# Patient Record
Sex: Male | Born: 1935
Health system: Southern US, Community
[De-identification: ages and names within clinical notes are randomized; demographics above are authoritative.]

## PROBLEM LIST (undated history)

## (undated) DIAGNOSIS — M5137 Other intervertebral disc degeneration, lumbosacral region: Secondary | ICD-10-CM

## (undated) DIAGNOSIS — M51379 Other intervertebral disc degeneration, lumbosacral region without mention of lumbar back pain or lower extremity pain: Secondary | ICD-10-CM

## (undated) DIAGNOSIS — K5790 Diverticulosis of intestine, part unspecified, without perforation or abscess without bleeding: Secondary | ICD-10-CM

## (undated) DIAGNOSIS — K602 Anal fissure, unspecified: Secondary | ICD-10-CM

## (undated) DIAGNOSIS — Z8679 Personal history of other diseases of the circulatory system: Secondary | ICD-10-CM

## (undated) DIAGNOSIS — M47816 Spondylosis without myelopathy or radiculopathy, lumbar region: Secondary | ICD-10-CM

## (undated) DIAGNOSIS — I6521 Occlusion and stenosis of right carotid artery: Principal | ICD-10-CM

## (undated) DIAGNOSIS — C61 Malignant neoplasm of prostate: Secondary | ICD-10-CM

## (undated) DIAGNOSIS — K219 Gastro-esophageal reflux disease without esophagitis: Secondary | ICD-10-CM

## (undated) DIAGNOSIS — Z79818 Long term (current) use of other agents affecting estrogen receptors and estrogen levels: Secondary | ICD-10-CM

## (undated) DIAGNOSIS — I2510 Atherosclerotic heart disease of native coronary artery without angina pectoris: Secondary | ICD-10-CM

## (undated) DIAGNOSIS — N529 Male erectile dysfunction, unspecified: Secondary | ICD-10-CM

## (undated) DIAGNOSIS — R972 Elevated prostate specific antigen [PSA]: Secondary | ICD-10-CM

## (undated) DIAGNOSIS — R7303 Prediabetes: Secondary | ICD-10-CM

## (undated) DIAGNOSIS — Z789 Other specified health status: Secondary | ICD-10-CM

## (undated) DIAGNOSIS — E785 Hyperlipidemia, unspecified: Secondary | ICD-10-CM

## (undated) DIAGNOSIS — K222 Esophageal obstruction: Secondary | ICD-10-CM

## (undated) DIAGNOSIS — G459 Transient cerebral ischemic attack, unspecified: Secondary | ICD-10-CM

## (undated) DIAGNOSIS — R7302 Impaired glucose tolerance (oral): Secondary | ICD-10-CM

## (undated) DIAGNOSIS — K589 Irritable bowel syndrome without diarrhea: Secondary | ICD-10-CM

## (undated) DIAGNOSIS — K279 Peptic ulcer, site unspecified, unspecified as acute or chronic, without hemorrhage or perforation: Secondary | ICD-10-CM

## (undated) DIAGNOSIS — T7840XA Allergy, unspecified, initial encounter: Secondary | ICD-10-CM

## (undated) DIAGNOSIS — N400 Enlarged prostate without lower urinary tract symptoms: Secondary | ICD-10-CM

## (undated) DIAGNOSIS — I10 Essential (primary) hypertension: Secondary | ICD-10-CM

## (undated) DIAGNOSIS — Z87898 Personal history of other specified conditions: Secondary | ICD-10-CM

## (undated) DIAGNOSIS — Z86010 Personal history of colonic polyps: Secondary | ICD-10-CM

## (undated) DIAGNOSIS — K429 Umbilical hernia without obstruction or gangrene: Secondary | ICD-10-CM

## (undated) HISTORY — PX: DUPUYTREN CONTRACTURE RELEASE: SHX1478

## (undated) HISTORY — DX: Peptic ulcer, site unspecified, unspecified as acute or chronic, without hemorrhage or perforation: K27.9

## (undated) HISTORY — DX: Benign prostatic hyperplasia without lower urinary tract symptoms: N40.0

## (undated) HISTORY — DX: Atherosclerotic heart disease of native coronary artery without angina pectoris: I25.10

## (undated) HISTORY — DX: Diverticulosis of intestine, part unspecified, without perforation or abscess without bleeding: K57.90

## (undated) HISTORY — DX: Occlusion and stenosis of right carotid artery: I65.21

## (undated) HISTORY — DX: Gastro-esophageal reflux disease without esophagitis: K21.9

## (undated) HISTORY — DX: Anal fissure, unspecified: K60.2

## (undated) HISTORY — PX: APPENDECTOMY: SHX54

## (undated) HISTORY — DX: Esophageal obstruction: K22.2

## (undated) HISTORY — PX: ARM WOUND REPAIR / CLOSURE: SUR1141

## (undated) HISTORY — DX: Impaired glucose tolerance (oral): R73.02

## (undated) HISTORY — DX: Irritable bowel syndrome without diarrhea: K58.9

## (undated) HISTORY — DX: Hyperlipidemia, unspecified: E78.5

## (undated) HISTORY — PX: HAND SURGERY: SHX662

## (undated) HISTORY — DX: Male erectile dysfunction, unspecified: N52.9

## (undated) HISTORY — DX: Personal history of colonic polyps: Z86.010

## (undated) HISTORY — DX: Personal history of other diseases of the circulatory system: Z86.79

## (undated) HISTORY — DX: Essential (primary) hypertension: I10

## (undated) HISTORY — DX: Elevated prostate specific antigen (PSA): R97.20

---

## 2002-11-01 DIAGNOSIS — K5790 Diverticulosis of intestine, part unspecified, without perforation or abscess without bleeding: Secondary | ICD-10-CM

## 2002-11-01 HISTORY — DX: Diverticulosis of intestine, part unspecified, without perforation or abscess without bleeding: K57.90

## 2003-04-05 ENCOUNTER — Encounter: Payer: Self-pay | Admitting: Internal Medicine

## 2003-04-05 ENCOUNTER — Encounter: Payer: Self-pay | Admitting: Cardiology

## 2003-04-05 ENCOUNTER — Encounter: Payer: Self-pay | Admitting: Emergency Medicine

## 2003-04-05 ENCOUNTER — Inpatient Hospital Stay (HOSPITAL_COMMUNITY): Admission: EM | Admit: 2003-04-05 | Discharge: 2003-04-06 | Payer: Self-pay | Admitting: Emergency Medicine

## 2003-04-24 ENCOUNTER — Encounter: Payer: Self-pay | Admitting: Neurology

## 2003-04-24 ENCOUNTER — Ambulatory Visit (HOSPITAL_COMMUNITY): Admission: RE | Admit: 2003-04-24 | Discharge: 2003-04-24 | Payer: Self-pay | Admitting: Neurology

## 2004-10-26 ENCOUNTER — Ambulatory Visit: Payer: Self-pay | Admitting: Internal Medicine

## 2004-11-26 ENCOUNTER — Ambulatory Visit: Payer: Self-pay | Admitting: Internal Medicine

## 2005-06-23 ENCOUNTER — Ambulatory Visit: Payer: Self-pay | Admitting: Internal Medicine

## 2005-10-21 ENCOUNTER — Ambulatory Visit: Payer: Self-pay | Admitting: Internal Medicine

## 2005-11-12 ENCOUNTER — Ambulatory Visit: Payer: Self-pay | Admitting: Internal Medicine

## 2005-11-19 ENCOUNTER — Ambulatory Visit: Payer: Self-pay | Admitting: Internal Medicine

## 2005-12-16 ENCOUNTER — Emergency Department (HOSPITAL_COMMUNITY): Admission: EM | Admit: 2005-12-16 | Discharge: 2005-12-16 | Payer: Self-pay | Admitting: Emergency Medicine

## 2006-07-12 ENCOUNTER — Ambulatory Visit: Payer: Self-pay | Admitting: Internal Medicine

## 2006-10-21 ENCOUNTER — Ambulatory Visit: Payer: Self-pay | Admitting: Internal Medicine

## 2006-11-16 ENCOUNTER — Ambulatory Visit: Payer: Self-pay | Admitting: Internal Medicine

## 2006-11-16 LAB — CONVERTED CEMR LAB
ALT: 21 units/L (ref 0–40)
AST: 23 units/L (ref 0–37)
Basophils Relative: 0.1 % (ref 0.0–1.0)
Bilirubin Urine: NEGATIVE
Chloride: 106 meq/L (ref 96–112)
Eosinophil percent: 1.8 % (ref 0.0–5.0)
GFR calc non Af Amer: 71 mL/min
HCT: 49.4 % (ref 39.0–52.0)
HDL: 38.1 mg/dL — ABNORMAL LOW (ref >39.0)
Hemoglobin, Urine: NEGATIVE
Leukocytes, UA: NEGATIVE
MCV: 92.3 fL (ref 78.0–100.0)
Monocytes Absolute: 0.6 10*3/uL (ref 0.2–0.7)
PSA: 14.03 ng/mL — ABNORMAL HIGH (ref 0.10–4.00)
Potassium: 3.8 meq/L (ref 3.5–5.1)
RBC: 5.36 M/uL (ref 4.22–5.81)
RDW: 13.4 % (ref 11.5–14.6)
Sodium: 140 meq/L (ref 135–145)
Specific Gravity, Urine: 1.02 (ref 1.000–1.03)
Total Protein, Urine: NEGATIVE mg/dL
Total Protein: 7 g/dL (ref 6.0–8.3)
Triglyceride fasting, serum: 51 mg/dL (ref 0–149)
WBC: 5.8 10*3/uL (ref 4.5–10.5)

## 2006-12-08 ENCOUNTER — Ambulatory Visit: Payer: Self-pay

## 2006-12-08 ENCOUNTER — Encounter: Payer: Self-pay | Admitting: Cardiology

## 2006-12-21 ENCOUNTER — Ambulatory Visit: Payer: Self-pay | Admitting: Internal Medicine

## 2007-07-19 ENCOUNTER — Ambulatory Visit: Payer: Self-pay | Admitting: Gastroenterology

## 2007-07-26 ENCOUNTER — Ambulatory Visit: Payer: Self-pay | Admitting: Gastroenterology

## 2007-10-02 ENCOUNTER — Ambulatory Visit: Payer: Self-pay | Admitting: Internal Medicine

## 2008-01-04 ENCOUNTER — Telehealth: Payer: Self-pay | Admitting: Internal Medicine

## 2008-02-07 ENCOUNTER — Ambulatory Visit: Payer: Self-pay | Admitting: Internal Medicine

## 2008-02-07 ENCOUNTER — Encounter (INDEPENDENT_AMBULATORY_CARE_PROVIDER_SITE_OTHER): Payer: Self-pay | Admitting: *Deleted

## 2008-02-07 DIAGNOSIS — K279 Peptic ulcer, site unspecified, unspecified as acute or chronic, without hemorrhage or perforation: Secondary | ICD-10-CM

## 2008-02-07 DIAGNOSIS — Z860100 Personal history of colon polyps, unspecified: Secondary | ICD-10-CM | POA: Insufficient documentation

## 2008-02-07 DIAGNOSIS — K219 Gastro-esophageal reflux disease without esophagitis: Secondary | ICD-10-CM

## 2008-02-07 DIAGNOSIS — Z8679 Personal history of other diseases of the circulatory system: Secondary | ICD-10-CM

## 2008-02-07 DIAGNOSIS — E739 Lactose intolerance, unspecified: Secondary | ICD-10-CM

## 2008-02-07 DIAGNOSIS — E785 Hyperlipidemia, unspecified: Secondary | ICD-10-CM | POA: Insufficient documentation

## 2008-02-07 DIAGNOSIS — I10 Essential (primary) hypertension: Secondary | ICD-10-CM

## 2008-02-07 DIAGNOSIS — R030 Elevated blood-pressure reading, without diagnosis of hypertension: Secondary | ICD-10-CM | POA: Insufficient documentation

## 2008-02-07 DIAGNOSIS — K589 Irritable bowel syndrome without diarrhea: Secondary | ICD-10-CM

## 2008-02-07 DIAGNOSIS — M259 Joint disorder, unspecified: Secondary | ICD-10-CM | POA: Insufficient documentation

## 2008-02-07 DIAGNOSIS — N400 Enlarged prostate without lower urinary tract symptoms: Secondary | ICD-10-CM

## 2008-02-07 HISTORY — DX: Benign prostatic hyperplasia without lower urinary tract symptoms: N40.0

## 2008-02-07 HISTORY — DX: Personal history of colonic polyps: Z86.010

## 2008-02-07 HISTORY — DX: Gastro-esophageal reflux disease without esophagitis: K21.9

## 2008-02-07 HISTORY — DX: Irritable bowel syndrome, unspecified: K58.9

## 2008-02-07 HISTORY — DX: Hyperlipidemia, unspecified: E78.5

## 2008-02-07 HISTORY — DX: Essential (primary) hypertension: I10

## 2008-02-07 HISTORY — DX: Personal history of other diseases of the circulatory system: Z86.79

## 2008-02-07 HISTORY — DX: Peptic ulcer, site unspecified, unspecified as acute or chronic, without hemorrhage or perforation: K27.9

## 2008-02-07 HISTORY — DX: Personal history of colon polyps, unspecified: Z86.0100

## 2008-02-26 ENCOUNTER — Encounter: Payer: Self-pay | Admitting: Internal Medicine

## 2008-09-24 ENCOUNTER — Ambulatory Visit: Payer: Self-pay | Admitting: Internal Medicine

## 2008-10-18 ENCOUNTER — Telehealth (INDEPENDENT_AMBULATORY_CARE_PROVIDER_SITE_OTHER): Payer: Self-pay | Admitting: *Deleted

## 2008-10-18 ENCOUNTER — Ambulatory Visit: Payer: Self-pay | Admitting: Internal Medicine

## 2008-10-30 ENCOUNTER — Telehealth (INDEPENDENT_AMBULATORY_CARE_PROVIDER_SITE_OTHER): Payer: Self-pay | Admitting: *Deleted

## 2009-01-16 ENCOUNTER — Ambulatory Visit: Payer: Self-pay | Admitting: Internal Medicine

## 2009-02-03 ENCOUNTER — Telehealth: Payer: Self-pay | Admitting: Internal Medicine

## 2009-03-28 ENCOUNTER — Ambulatory Visit: Payer: Self-pay | Admitting: Internal Medicine

## 2009-03-28 DIAGNOSIS — M79609 Pain in unspecified limb: Secondary | ICD-10-CM

## 2009-03-28 DIAGNOSIS — M549 Dorsalgia, unspecified: Secondary | ICD-10-CM | POA: Insufficient documentation

## 2009-09-30 ENCOUNTER — Ambulatory Visit: Payer: Self-pay | Admitting: Internal Medicine

## 2010-05-01 ENCOUNTER — Ambulatory Visit: Payer: Self-pay | Admitting: Internal Medicine

## 2010-08-05 ENCOUNTER — Ambulatory Visit: Payer: Self-pay | Admitting: Cardiology

## 2010-08-05 ENCOUNTER — Ambulatory Visit: Payer: Self-pay | Admitting: Internal Medicine

## 2010-08-05 DIAGNOSIS — R519 Headache, unspecified: Secondary | ICD-10-CM | POA: Insufficient documentation

## 2010-08-05 DIAGNOSIS — M542 Cervicalgia: Secondary | ICD-10-CM

## 2010-08-05 DIAGNOSIS — R51 Headache: Secondary | ICD-10-CM

## 2010-08-06 ENCOUNTER — Ambulatory Visit: Payer: Self-pay | Admitting: Cardiovascular Disease

## 2010-09-10 ENCOUNTER — Ambulatory Visit: Payer: Self-pay | Admitting: Internal Medicine

## 2011-01-17 LAB — CONVERTED CEMR LAB
ALT: 22 units/L (ref 0–53)
AST: 21 units/L (ref 0–37)
Albumin: 3.9 g/dL (ref 3.5–5.2)
Alkaline Phosphatase: 74 units/L (ref 39–117)
Alkaline Phosphatase: 77 units/L (ref 39–117)
Alkaline Phosphatase: 83 units/L (ref 39–117)
BUN: 13 mg/dL (ref 6–23)
BUN: 14 mg/dL (ref 6–23)
BUN: 14 mg/dL (ref 6–23)
Basophils Absolute: 0 10*3/uL (ref 0.0–0.1)
Basophils Absolute: 0.1 10*3/uL (ref 0.0–0.1)
Basophils Relative: 0.2 % (ref 0.0–3.0)
Basophils Relative: 2.4 % — ABNORMAL HIGH (ref 0.0–1.0)
Bilirubin Urine: NEGATIVE
Bilirubin Urine: NEGATIVE
Bilirubin, Direct: 0.1 mg/dL (ref 0.0–0.3)
Bilirubin, Direct: 0.2 mg/dL (ref 0.0–0.3)
CO2: 28 meq/L (ref 19–32)
CO2: 30 meq/L (ref 19–32)
Calcium: 9.2 mg/dL (ref 8.4–10.5)
Calcium: 9.3 mg/dL (ref 8.4–10.5)
Cholesterol: 149 mg/dL (ref 0–200)
Creatinine, Ser: 1.1 mg/dL (ref 0.4–1.5)
Eosinophils Relative: 2 % (ref 0.0–5.0)
GFR calc Af Amer: 85 mL/min
GFR calc non Af Amer: 79.81 mL/min (ref >60)
Glucose, Bld: 96 mg/dL (ref 70–99)
Glucose, Bld: 99 mg/dL (ref 70–99)
HCT: 44.5 % (ref 39.0–52.0)
HCT: 46 % (ref 39.0–52.0)
HDL: 36.3 mg/dL — ABNORMAL LOW (ref >39.0)
HDL: 39.3 mg/dL (ref >39.0)
Hemoglobin, Urine: NEGATIVE
Hemoglobin, Urine: NEGATIVE
Hemoglobin: 15.4 g/dL (ref 13.0–17.0)
Hemoglobin: 15.8 g/dL (ref 13.0–17.0)
Hgb A1c MFr Bld: 6 % (ref 4.6–6.0)
LDL Cholesterol: 72 mg/dL (ref 0–99)
Leukocytes, UA: NEGATIVE
Leukocytes, UA: NEGATIVE
Leukocytes, UA: NEGATIVE
Lymphocytes Relative: 20.6 % (ref 12.0–46.0)
Lymphocytes Relative: 22.9 % (ref 12.0–46.0)
Lymphocytes Relative: 23.5 % (ref 12.0–46.0)
MCHC: 34.5 g/dL (ref 30.0–36.0)
Monocytes Absolute: 0.4 10*3/uL (ref 0.2–0.7)
Monocytes Absolute: 0.6 10*3/uL (ref 0.1–1.0)
Monocytes Relative: 12.2 % — ABNORMAL HIGH (ref 3.0–12.0)
Monocytes Relative: 7.7 % (ref 3.0–11.0)
Monocytes Relative: 9.9 % (ref 3.0–12.0)
Neutro Abs: 3.7 10*3/uL (ref 1.4–7.7)
Neutro Abs: 3.8 10*3/uL (ref 1.4–7.7)
Neutrophils Relative %: 67.9 % (ref 43.0–77.0)
Nitrite: NEGATIVE
Nitrite: NEGATIVE
PSA: 18.5 ng/mL — ABNORMAL HIGH (ref 0.10–4.00)
Platelets: 244 10*3/uL (ref 150.0–400.0)
Potassium: 3.9 meq/L (ref 3.5–5.1)
RBC: 5.06 M/uL (ref 4.22–5.81)
RDW: 13.3 % (ref 11.5–14.6)
RDW: 14 % (ref 11.5–14.6)
Sodium: 142 meq/L (ref 135–145)
Sodium: 142 meq/L (ref 135–145)
Sodium: 142 meq/L (ref 135–145)
Specific Gravity, Urine: 1.025 (ref 1.000–1.03)
TSH: 1.03 microintl units/mL (ref 0.35–5.50)
TSH: 1.34 microintl units/mL (ref 0.35–5.50)
Total Bilirubin: 0.6 mg/dL (ref 0.3–1.2)
Total Bilirubin: 0.8 mg/dL (ref 0.3–1.2)
Total CHOL/HDL Ratio: 3
Total CHOL/HDL Ratio: 3.4
Total Protein, Urine: NEGATIVE mg/dL
Total Protein, Urine: NEGATIVE mg/dL
Total Protein: 7 g/dL (ref 6.0–8.3)
Total Protein: 7.1 g/dL (ref 6.0–8.3)
Triglycerides: 54 mg/dL (ref 0–149)
Urobilinogen, UA: 0.2 (ref 0.0–1.0)
Urobilinogen, UA: 0.2 (ref 0.0–1.0)
VLDL: 10.8 mg/dL (ref 0.0–40.0)
WBC: 5.8 10*3/uL (ref 4.5–10.5)
WBC: 5.9 10*3/uL (ref 4.5–10.5)
pH: 5 (ref 5.0–8.0)

## 2011-01-21 NOTE — Assessment & Plan Note (Signed)
Summary: flu shot/Daniel Rowland/cd   Nurse Visit   Allergies: No Known Drug Allergies  Orders Added: 1)  Flu Vaccine 59yrs + MEDICARE PATIENTS [Q2039] 2)  Administration Flu vaccine - MCR [G0008] Flu Vaccine Consent Questions     Do you have a history of severe allergic reactions to this vaccine? no    Any prior history of allergic reactions to egg and/or gelatin? no    Do you have a sensitivity to the preservative Thimersol? no    Do you have a past history of Guillan-Barre Syndrome? no    Do you currently have an acute febrile illness? no    Have you ever had a severe reaction to latex? no    Vaccine information given and explained to patient? yes    Are you currently pregnant? no    Lot Number:[id number]   Exp Date:06/19/2011   Site Given  Left Deltoid IM.lbmedflu
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
## 2011-05-07 NOTE — H&P (Signed)
NAME:  Daniel Rowland, Daniel Rowland                           ACCOUNT NO.:  [id number]   MEDICAL RECORD NO.:  [id number]                   PATIENT TYPE:  INP   LOCATION:  0484                                 FACILITY:  [facility]   PHYSICIAN:  Sean A. Everardo All, M.D. [facility]           DATE OF BIRTH:  05-17-1935   DATE OF ADMISSION:  04/05/2003  DATE OF DISCHARGE:                                HISTORY & PHYSICAL   REASON FOR ADMISSION:  Transient ischemic attack.   HISTORY OF PRESENT ILLNESS:  The patient is a 76 year old man who four hours  ago had the onset of severe left facial and left arm numbness.  The episode  lasted in severe state for only about a minute, and then almost completely  resolved.  He now has minimal residual numbness at these two areas.  There  is associated slurred speech which appears to have complete resolved.   PAST MEDICAL HISTORY:  Gastroesophageal reflux disease.  No history of  diabetes, hypertension, dyslipidemia, heart disease, transient ischemic  attack, nor cerebrovascular accident.   MEDICATIONS:  Nexium 40 mg daily.   SOCIAL HISTORY:  The patient denies drinking or smoking.  He is retired and  married.  His wife is with him.   FAMILY HISTORY:  The patient's mother had a cerebrovascular accident at the  age of 23.   REVIEW OF SYMPTOMS:  Denies headache, loss of consciousness, nausea,  vomiting, diarrhea, chest pain, blurry vision, seizure, abdominal pain,  rectal bleeding, or hematuria.   PHYSICAL EXAMINATION:  VITAL SIGNS:  Blood pressure 160/82, heart rate of  67, respiratory rate 16, temperature 97.2.  GENERAL:  In no distress.  SKIN:  Not diaphoretic.  HEENT:  Head is atraumatic.  Sclerae nonicteric.  Pharynx is clear.  NECK:  Supple.  CHEST:  Clear to auscultation.  CARDIOVASCULAR:  No JVD, no edema, regular rate and rhythm, no murmur.  Pedal pulses are intact.  ABDOMEN:  Soft, nontender, no hepatosplenomegaly, no mass.  EXTREMITIES:  No obvious  deformities of the joints.  NEUROLOGIC:  Alert and oriented.  Cranial nerves appear to be intact except  there is minimally decreased sensation on the left side of the face.  Speech  is normal.  Motor function diffusely 5/5.  Sensation intact to touch except  it is minimally decreased at the left upper extremity.   LABORATORY DATA:  Electrocardiogram normal.  Head CT scan without contrast  is normal.   IMPRESSION:  1. Transient ischemic attack.  2. Reflex hypertension.   PLAN:  1. Admit.  2. Aspirin.  3. Carotid ultrasound and echocardiography.  4. Treat hypertension to minimally hypertensive range.  Sean A. Everardo All, M.D. [facility]    SAE/MEDQ  D:  04/05/2003  T:  04/05/2003  Job:  [id number]

## 2011-05-07 NOTE — Consult Note (Signed)
   NAME:  Daniel Rowland, Daniel Rowland NO.:  [id number]   MEDICAL RECORD NO.:  [id number]                   PATIENT TYPE:  INP   LOCATION:  0484                                 FACILITY:  [facility]   PHYSICIAN:  Melvyn Novas, M.D.               DATE OF BIRTH:  [birth year]   DATE OF CONSULTATION:  DATE OF DISCHARGE:                                   CONSULTATION                                               Melvyn Novas, M.D.    CD/MEDQ  D:  04/05/2003  T:  04/06/2003  Job:  [id number]

## 2011-05-07 NOTE — Discharge Summary (Signed)
NAME:  Daniel Rowland, Daniel Rowland                           ACCOUNT NO.:  [id number]   MEDICAL RECORD NO.:  [id number]                   PATIENT TYPE:  INP   LOCATION:  0484                                 [facility]ACILITY[facility]  [facility]   PHYSICIAN:  Rene Paci, M.D. [facility]          DATE OF BIRTH:  Jan 14, 1935   DATE OF ADMISSION:  04/05/2003  DATE OF DISCHARGE:  04/06/2003                                 DISCHARGE SUMMARY   DISCHARGE DIAGNOSES:  1. Left face numbness consistent with transient ischemic attack, resolved.  2. Incidental cavernous angioma in the left pons region detected on MRA,     asymptomatic.  3. Elevated blood pressure, intermittent.  4. Positive family history of diabetes; the patient with slight high normal     glucose, no diagnostic criteria for diabetes.   DISCHARGE MEDICATIONS:  1. Aspirin 81 mg p.o. daily.  2. Plavix 75 mg p.o. daily for at least three months.  3. Nexium 40 mg p.o. daily as prior to admission.   CONDITION ON DISCHARGE:  Medically improved and stable.   DISPOSITION:  The patient is discharged to home where he resides with his  wife.  He is newly retired, does not smoke or drink.   HOSPITAL FOLLOW-UP:  1. With his primary care physician, Dr. Efrain Sella; he is to call for an     appointment in two to four weeks.  2. He is also to contact interventional radiologist, Dr. Corliss Skains, at [facility] for an appointment regarding brain angiogram procedure to     further evaluate his incidental cavernous angioma detected on MRA.     Interventional radiology has also been provided the patient's name and     home telephone number to help coordinate this appointment.   HOSPITAL COURSE BY PROBLEM:  #1 - TRANSIENT ISCHEMIC ATTACK SYMPTOMS.  The  patient presented to the emergency room early the morning of admission  complaining of four hours of left face numbness associated with some  slurring of speech.  Over the four hours he also had some left arm numbness  but by the time he had reached the emergency room within those four hours  his numbness symptoms had resolved.  He had no history of hypertension,  hypercholesterolemia, diabetes, and he was a nonsmoker.  However, he was  admitted for observation to rule out stroke and to undergo further  evaluation.  A 2-D echo, carotid Dopplers, and MRI/MRA were performed.  The  2-D echo is still pending at time of discharge.  Carotid Doppler is  negative.  MRI negative for any acute event though showed scattered small  vessel disease of insignificant consequence.  Incidentally on the MRA was a  left pons cavernous angioma.  Neurologic was called in for consultation and  agreed with treatment of his TIA symptoms by the addition of aspirin and  Plavix to his medical regimen, as he was  taking neither prior to admission;  also, follow-up evaluation of his elevated blood pressure and elevated  glucose.  Fasting lipid profile, hemoglobin A1c, and homocysteine B levels  were pending at time of discharge.   #2 - INCIDENTAL CAVERNOUS ANGIOMA.  As stated above, this was found on MRA.  Dr. Vickey Huger of neurology recommended further evaluation with angiogram by  interventional radiologist, Dr. Corliss Skains.  Due to discharge being done on a  Saturday, an exact appointment date could not be arranged prior to discharge  but  interventional radiology was provided with the patient's information and  home telephone number and will contact the patient within the next week to  arrange a meeting to discuss angiogram.  Likewise, the patient has also been  given information on how to contact interventional radiology.                                               Rene Paci, M.D. [facility]    VL/MEDQ  D:  04/06/2003  T:  04/06/2003  Job:  [id number]

## 2011-05-29 ENCOUNTER — Other Ambulatory Visit: Payer: Self-pay | Admitting: Internal Medicine

## 2011-08-29 ENCOUNTER — Other Ambulatory Visit: Payer: Self-pay | Admitting: Internal Medicine

## 2011-09-15 ENCOUNTER — Ambulatory Visit (INDEPENDENT_AMBULATORY_CARE_PROVIDER_SITE_OTHER): Payer: Medicare Other

## 2011-09-15 DIAGNOSIS — Z23 Encounter for immunization: Secondary | ICD-10-CM

## 2011-12-06 ENCOUNTER — Other Ambulatory Visit: Payer: Self-pay | Admitting: Internal Medicine

## 2012-01-03 ENCOUNTER — Telehealth: Payer: Self-pay

## 2012-01-03 DIAGNOSIS — Z1289 Encounter for screening for malignant neoplasm of other sites: Secondary | ICD-10-CM

## 2012-01-03 DIAGNOSIS — Z0000 Encounter for general adult medical examination without abnormal findings: Secondary | ICD-10-CM

## 2012-01-03 NOTE — Telephone Encounter (Signed)
Put order in for physical labs. 

## 2012-01-10 ENCOUNTER — Other Ambulatory Visit: Payer: Self-pay | Admitting: Internal Medicine

## 2012-01-30 ENCOUNTER — Emergency Department (HOSPITAL_COMMUNITY): Payer: Medicare Other

## 2012-01-30 ENCOUNTER — Emergency Department (HOSPITAL_COMMUNITY)
Admission: EM | Admit: 2012-01-30 | Discharge: 2012-01-31 | Disposition: A | Discharge status: Home / Self Care | Payer: Medicare Other | Attending: Emergency Medicine | Admitting: Emergency Medicine

## 2012-01-30 ENCOUNTER — Encounter (HOSPITAL_COMMUNITY): Payer: Self-pay

## 2012-01-30 DIAGNOSIS — R066 Hiccough: Secondary | ICD-10-CM | POA: Insufficient documentation

## 2012-01-30 DIAGNOSIS — K219 Gastro-esophageal reflux disease without esophagitis: Secondary | ICD-10-CM | POA: Insufficient documentation

## 2012-01-30 HISTORY — DX: Gastro-esophageal reflux disease without esophagitis: K21.9

## 2012-01-30 LAB — COMPREHENSIVE METABOLIC PANEL
AST: 21 U/L (ref 0–37)
Albumin: 3.7 g/dL (ref 3.5–5.2)
Calcium: 9.5 mg/dL (ref 8.4–10.5)
Creatinine, Ser: 0.91 mg/dL (ref 0.50–1.35)

## 2012-01-30 LAB — DIFFERENTIAL
Eosinophils Relative: 2 % (ref 0–5)
Lymphocytes Relative: 25 % (ref 12–46)
Lymphs Abs: 1.8 10*3/uL (ref 0.7–4.0)
Monocytes Absolute: 1 10*3/uL (ref 0.1–1.0)

## 2012-01-30 LAB — CBC
HCT: 44.9 % (ref 39.0–52.0)
MCV: 89.8 fL (ref 78.0–100.0)
RBC: 5 MIL/uL (ref 4.22–5.81)
WBC: 7 10*3/uL (ref 4.0–10.5)

## 2012-01-30 MED ORDER — SODIUM CHLORIDE 0.9 % IV SOLN
25.0000 mg | Freq: Once | INTRAVENOUS | Status: AC
  Administered 2012-01-30: 25 mg via INTRAVENOUS
  Filled 2012-01-30: qty 1

## 2012-01-30 MED ORDER — SODIUM CHLORIDE 0.9 % IV BOLUS (SEPSIS)
1000.0000 mL | Freq: Once | INTRAVENOUS | Status: AC
  Administered 2012-01-30: 1000 mL via INTRAVENOUS

## 2012-01-30 NOTE — ED Notes (Signed)
Pt has had the hiccups for 24 hours, also states he has been nauseated for two days prior to the hiccups

## 2012-01-30 NOTE — ED Provider Notes (Signed)
History     CSN: [id number]  Arrival date & time 01/30/12  2008   First MD Initiated Contact with Patient 01/30/12 2114      Chief Complaint  Patient presents with  . Hiccups     HPI  History provided by the patient. Patient is a 77 year old male with past history of GERD who presents with complaints of persistent hiccups. Patient states symptoms began after eating a chocolate covered cherries he Friday evening around 8 PM. Patient was persisted all day Saturday and patient called PCPs office. He was instructed to come to the emergency room if symptoms persisted for greater than 24 hours. Symptoms began acutely and are described as mild. Patient denies any other symptoms. He denies chest pain, shortness of breath, difficulty swallowing, fever, chills, urinary changes. Patient has attempted multiple home remedies to cure hiccups without success. He has attempted holding breath, drinking soda in all positions, spoonful sure, and nothing has worked. She denies any other aggravating or alleviating factors.   Past Medical History  Diagnosis Date  . GERD (gastroesophageal reflux disease)     History reviewed. No pertinent past surgical history.  History reviewed. No pertinent family history.  History  Substance Use Topics  . Smoking status: Not on file  . Smokeless tobacco: Not on file  . Alcohol Use: No      Review of Systems  Constitutional: Negative for fever and chills.  Respiratory: Negative for shortness of breath.   Cardiovascular: Negative for chest pain.  Gastrointestinal: Negative for abdominal pain.  All other systems reviewed and are negative.    Allergies  Sulfa antibiotics  Home Medications   Current Outpatient Rx  Name Route Sig Dispense Refill  . ASPIRIN EC 81 MG PO TBEC Oral Take 81 mg by mouth daily.    . FESOTERODINE FUMARATE ER 8 MG PO TB24 Oral Take 8 mg by mouth daily.    Marland Kitchen LOVASTATIN 40 MG PO TABS  TAKE 1 TABLET BY MOUTH EVERY DAY 30 tablet 1    . CENTRUM SILVER ULTRA MENS PO TABS Oral Take 1 tablet by mouth daily.    Marland Kitchen NEXIUM 40 MG PO CPDR  TAKE 1 CAPSULE EVERY DAY 90 capsule 0  . VITAMIN B-12 1000 MCG PO TABS Oral Take 1,000 mcg by mouth daily.      BP 131/77  Pulse 105  Temp(Src) 98.6 F (37 C) (Oral)  Resp 20  SpO2 97%  Physical Exam  Nursing note and vitals reviewed. Constitutional: He is oriented to person, place, and time. He appears well-developed and well-nourished. No distress.  HENT:  Head: Normocephalic and atraumatic.  Mouth/Throat: Oropharynx is clear and moist.  Neck: Normal range of motion. Neck supple. No tracheal deviation present. No thyromegaly present.       No mass  Cardiovascular: Normal rate and regular rhythm.   Pulmonary/Chest: Effort normal and breath sounds normal. No stridor. No respiratory distress. He has no wheezes. He has no rales. He exhibits no tenderness.       Multiple hiccups on exam  Abdominal: Soft. He exhibits no mass. There is no tenderness. There is no rebound and no guarding.  Neurological: He is alert and oriented to person, place, and time.  Skin: Skin is warm. No rash noted.  Psychiatric: He has a normal mood and affect. His behavior is normal.    ED Course  Procedures    Labs Reviewed  COMPREHENSIVE METABOLIC PANEL  LIPASE, BLOOD  CBC  DIFFERENTIAL  Results for orders placed during the hospital encounter of 01/30/12  COMPREHENSIVE METABOLIC PANEL      Component Value Range   Sodium 141  135 - 145 (mEq/L)   Potassium 3.8  3.5 - 5.1 (mEq/L)   Chloride 107  96 - 112 (mEq/L)   CO2 24  19 - 32 (mEq/L)   Glucose, Bld 140 (*) 70 - 99 (mg/dL)   BUN 12  6 - 23 (mg/dL)   Creatinine, Ser 0.98  0.50 - 1.35 (mg/dL)   Calcium 9.5  8.4 - 11.9 (mg/dL)   Total Protein 7.4  6.0 - 8.3 (g/dL)   Albumin 3.7  3.5 - 5.2 (g/dL)   AST 21  0 - 37 (U/L)   ALT 18  0 - 53 (U/L)   Alkaline Phosphatase 87  39 - 117 (U/L)   Total Bilirubin 0.4  0.3 - 1.2 (mg/dL)   GFR calc non Af  Amer 81 (*) >90 (mL/min)   GFR calc Af Amer >90  >90 (mL/min)  LIPASE, BLOOD      Component Value Range   Lipase 24  11 - 59 (U/L)  CBC      Component Value Range   WBC 7.0  4.0 - 10.5 (K/uL)   RBC 5.00  4.22 - 5.81 (MIL/uL)   Hemoglobin 15.7  13.0 - 17.0 (g/dL)   HCT 14.7  82.9 - 56.2 (%)   MCV 89.8  78.0 - 100.0 (fL)   MCH 31.4  26.0 - 34.0 (pg)   MCHC 35.0  30.0 - 36.0 (g/dL)   RDW 13.0  86.5 - 78.4 (%)   Platelets 280  150 - 400 (K/uL)  DIFFERENTIAL      Component Value Range   Neutrophils Relative 58  43 - 77 (%)   Neutro Abs 4.1  1.7 - 7.7 (K/uL)   Lymphocytes Relative 25  12 - 46 (%)   Lymphs Abs 1.8  0.7 - 4.0 (K/uL)   Monocytes Relative 15 (*) 3 - 12 (%)   Monocytes Absolute 1.0  0.1 - 1.0 (K/uL)   Eosinophils Relative 2  0 - 5 (%)   Eosinophils Absolute 0.1  0.0 - 0.7 (K/uL)   Basophils Relative 0  0 - 1 (%)   Basophils Absolute 0.0  0.0 - 0.1 (K/uL)      Dg Chest 2 View  01/30/2012  *RADIOLOGY REPORT*  Clinical Data: Hiccups.  CHEST - 2 VIEW  Comparison: Portable chest 12/16/2005.  Findings: The heart size is normal.  Chronic interstitial coarsening is similar to the prior study.  Mild emphysematous changes are evident.  Degenerative changes are noted in the thoracic spine.  IMPRESSION:  1.  No acute cardiopulmonary disease. 2.  Emphysema and mild interstitial coarsening, likely chronic.  Original Report Authenticated By: Jamesetta Orleans. MATTERN, M.D.     1. Intractable hiccups       MDM  9:15PM she is seen and evaluated. Patient in no acute distress. Patient with persistent hiccups. he was no respiratory distress, normal respirations and good O2 sats.    11:50 PM patient has now been 30 minutes without hiccups after receiving treatment. Labs and x-rays unremarkable. Will discharge at this time.    Angus Seller, [place] 01/30/12 2355

## 2012-01-31 ENCOUNTER — Telehealth: Payer: Self-pay

## 2012-01-31 MED ORDER — SODIUM CHLORIDE 0.9 % IV BOLUS (SEPSIS)
1000.0000 mL | Freq: Once | INTRAVENOUS | Status: AC
  Administered 2012-01-31: 1000 mL via INTRAVENOUS

## 2012-01-31 NOTE — ED Notes (Signed)
Pt stood at bedside and took several steps.  Pt denying dizziness.

## 2012-01-31 NOTE — Telephone Encounter (Signed)
[facility] Triage Call Report Triage Record Num: [id number] Operator: Tomasita Crumble Patient Name: [facility] Call Date & Time: 01/30/2012 9:44:55AM Patient Phone: [phone] PCP: Patient Gender: Male PCP Fax : Patient DOB: [birth year] Practice Name: [facility] Reason for Call: Caller: Carolyn/Spouse; PCP: Oliver Barre; CB#: [phone]; Call Reason: Hiccups Since 01/29/12 @ 20:00; Afebrile; Home treatment(s) tried: water, and breating slowly without improvement. Hiccups protocol directed to Heartburn protocol due to indigestion. Caller relates sx started after he ate chocolate covered cherry. Advised home care and parameters for follow up per Hiccups protocol and Heartburn protocol. Caller voiced understanding. Protocol(s) Used: Heartburn Protocol(s) Used: Hiccups Recommended Outcome per Protocol: See Provider within 72 Hours Reason for Outcome: Associated with heartburn (belching/burping) Sensation of fullness, belching, nausea, or acid taste (not related to exertion) associated with specific foods, overeating or alcohol consumption one or two times per week All other situations Care Advice: ~ 01/30/2012 10:01:46AM Page 1 of 1 [id number]

## 2012-01-31 NOTE — ED Provider Notes (Signed)
Medical screening examination/treatment/procedure(s) were performed by non-physician practitioner and as supervising physician I was immediately available for consultation/collaboration.    Nelia Shi, MD 01/31/12 [phone]

## 2012-01-31 NOTE — ED Notes (Signed)
Pt was unable to be discharged at time of being placed up for discharge due to dizziness.  Pt given 1000 ml NS, assessing vitals.

## 2012-02-06 ENCOUNTER — Encounter: Payer: Self-pay | Admitting: Internal Medicine

## 2012-02-06 DIAGNOSIS — Z0001 Encounter for general adult medical examination with abnormal findings: Secondary | ICD-10-CM | POA: Insufficient documentation

## 2012-02-06 DIAGNOSIS — R7302 Impaired glucose tolerance (oral): Secondary | ICD-10-CM

## 2012-02-06 DIAGNOSIS — Z0000 Encounter for general adult medical examination without abnormal findings: Secondary | ICD-10-CM | POA: Insufficient documentation

## 2012-02-06 DIAGNOSIS — R7303 Prediabetes: Secondary | ICD-10-CM | POA: Insufficient documentation

## 2012-02-06 HISTORY — DX: Impaired glucose tolerance (oral): R73.02

## 2012-02-10 ENCOUNTER — Encounter: Payer: Self-pay | Admitting: Internal Medicine

## 2012-02-10 ENCOUNTER — Ambulatory Visit (INDEPENDENT_AMBULATORY_CARE_PROVIDER_SITE_OTHER): Payer: Medicare Other | Admitting: Internal Medicine

## 2012-02-10 VITALS — BP 122/62 | HR 93 | Temp 97.6°F | Ht 69.0 in | Wt 209.1 lb

## 2012-02-10 DIAGNOSIS — R972 Elevated prostate specific antigen [PSA]: Secondary | ICD-10-CM | POA: Insufficient documentation

## 2012-02-10 DIAGNOSIS — R7302 Impaired glucose tolerance (oral): Secondary | ICD-10-CM

## 2012-02-10 DIAGNOSIS — Z0000 Encounter for general adult medical examination without abnormal findings: Secondary | ICD-10-CM

## 2012-02-10 DIAGNOSIS — N529 Male erectile dysfunction, unspecified: Secondary | ICD-10-CM | POA: Insufficient documentation

## 2012-02-10 DIAGNOSIS — H109 Unspecified conjunctivitis: Secondary | ICD-10-CM

## 2012-02-10 DIAGNOSIS — R7309 Other abnormal glucose: Secondary | ICD-10-CM

## 2012-02-10 DIAGNOSIS — K222 Esophageal obstruction: Secondary | ICD-10-CM | POA: Insufficient documentation

## 2012-02-10 MED ORDER — TOBRAMYCIN 0.3 % OP SOLN: 1.0000 [drp] | Freq: Four times a day (QID) | OPHTHALMIC | Status: AC

## 2012-02-10 MED ORDER — ESOMEPRAZOLE MAGNESIUM 40 MG PO CPDR: 40.0000 mg | DELAYED_RELEASE_CAPSULE | Freq: Every day | ORAL | Status: DC

## 2012-02-10 MED ORDER — FESOTERODINE FUMARATE ER 8 MG PO TB24: 8.0000 mg | ORAL_TABLET | Freq: Every day | ORAL | Status: DC

## 2012-02-10 MED ORDER — LOVASTATIN 40 MG PO TABS: 40.0000 mg | ORAL_TABLET | Freq: Every day | ORAL | Status: DC

## 2012-02-10 NOTE — Assessment & Plan Note (Signed)
stable overall by hx and exam, most recent data reviewed with pt, and pt to continue medical treatment as before  Lab Results  Component Value Date   HGBA1C 6.0 02/07/2008

## 2012-02-10 NOTE — Assessment & Plan Note (Signed)
Mild to mod, for antibx course,  to f/u any worsening symptoms or concerns 

## 2012-02-10 NOTE — Patient Instructions (Addendum)
Take all new medications as prescribed - the antibiotic drop for the left eye Continue all other medications as before Your refills were all sent to the pharmacy today You can also use benadryl OTC or allegra OTC for itching as well OK to follow the right leg swelling for now, though you may want to keep it more elevated if you are sitting, lower salt diet, or even consider a low dose fluid pill if it becomes worse Please go to LAB in the Basement for the blood and/or urine tests to be done today Please call the phone number [phone] (the [employer] System) for results of testing in 2-3 days;  When calling, simply dial the number, and when prompted enter the MRN number above (the Medical Record Number) and the # key, then the message should start. Please return in 1 year for your yearly visit, or sooner if needed, with Lab testing done 3-5 days before

## 2012-02-10 NOTE — Assessment & Plan Note (Addendum)
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
## 2012-04-02 ENCOUNTER — Other Ambulatory Visit: Payer: Self-pay | Admitting: Internal Medicine

## 2012-04-06 ENCOUNTER — Other Ambulatory Visit: Payer: Self-pay | Admitting: Internal Medicine

## 2012-07-23 ENCOUNTER — Other Ambulatory Visit: Payer: Self-pay | Admitting: Internal Medicine

## 2012-09-19 ENCOUNTER — Ambulatory Visit (INDEPENDENT_AMBULATORY_CARE_PROVIDER_SITE_OTHER): Payer: Medicare Other | Admitting: *Deleted

## 2012-09-19 DIAGNOSIS — Z23 Encounter for immunization: Secondary | ICD-10-CM

## 2012-12-21 DIAGNOSIS — Z87898 Personal history of other specified conditions: Secondary | ICD-10-CM

## 2012-12-21 DIAGNOSIS — C61 Malignant neoplasm of prostate: Secondary | ICD-10-CM

## 2012-12-21 HISTORY — PX: PROSTATE BIOPSY: SHX241

## 2012-12-21 HISTORY — DX: Malignant neoplasm of prostate: C61

## 2012-12-21 HISTORY — DX: Personal history of other specified conditions: Z87.898

## 2013-01-02 ENCOUNTER — Other Ambulatory Visit (HOSPITAL_COMMUNITY): Payer: Self-pay | Admitting: Urology

## 2013-01-02 DIAGNOSIS — C61 Malignant neoplasm of prostate: Secondary | ICD-10-CM

## 2013-01-10 ENCOUNTER — Encounter (HOSPITAL_COMMUNITY)
Admission: RE | Admit: 2013-01-10 | Discharge: 2013-01-10 | Disposition: A | Discharge status: Home / Self Care | Payer: Medicare Other | Source: Ambulatory Visit | Attending: Urology | Admitting: Urology

## 2013-01-10 ENCOUNTER — Encounter (HOSPITAL_COMMUNITY): Payer: Self-pay

## 2013-01-10 DIAGNOSIS — C61 Malignant neoplasm of prostate: Secondary | ICD-10-CM | POA: Insufficient documentation

## 2013-01-10 MED ORDER — TECHNETIUM TC 99M MEDRONATE IV KIT
25.2000 | PACK | Freq: Once | INTRAVENOUS | Status: AC | PRN
  Administered 2013-01-10: 25.2 via INTRAVENOUS

## 2013-01-16 DIAGNOSIS — Z79818 Long term (current) use of other agents affecting estrogen receptors and estrogen levels: Secondary | ICD-10-CM

## 2013-01-16 HISTORY — DX: Long term (current) use of other agents affecting estrogen receptors and estrogen levels: Z79.818

## 2013-01-26 ENCOUNTER — Encounter: Payer: Self-pay | Admitting: Radiation Oncology

## 2013-01-26 DIAGNOSIS — M5137 Other intervertebral disc degeneration, lumbosacral region: Secondary | ICD-10-CM | POA: Insufficient documentation

## 2013-01-26 DIAGNOSIS — Z87898 Personal history of other specified conditions: Secondary | ICD-10-CM | POA: Insufficient documentation

## 2013-01-26 NOTE — Progress Notes (Signed)
New Consult Prostate Cancer Biopsy 12/21/2012,Adenocvarcinoma,gleason=3+4=7, & 4+3=7, PSA=36.99 volume=62cc  Hx Elevated PSA's sine 9/92  With biopsy ,Bph only, Biopsy again 03/1999,BPH and a few atypical glands 3rd biopsy 11/03=BPH   Married, Retired Office manager daughters, Father deceased prostate &bone cancer age 76,Mother deceased heart disease age 7,  Alert,oriented x3, self caths every morning and evenings, no bowel movement 4 days stated, not eating like normal, no pain,very weak stream,   Allergies: Sulfa No history Radiation  No history of a Pacemaker

## 2013-01-28 ENCOUNTER — Encounter: Payer: Self-pay | Admitting: Radiation Oncology
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
## 2013-01-29 ENCOUNTER — Telehealth: Payer: Self-pay | Admitting: *Deleted

## 2013-01-29 ENCOUNTER — Ambulatory Visit
Admission: RE | Admit: 2013-01-29 | Discharge: 2013-01-29 | Disposition: A | Discharge status: Home / Self Care | Payer: Medicare Other | Source: Ambulatory Visit | Attending: Radiation Oncology | Admitting: Radiation Oncology

## 2013-01-29 ENCOUNTER — Encounter: Payer: Self-pay | Admitting: Radiation Oncology

## 2013-01-29 VITALS — BP 130/86 | HR 92 | Temp 98.0°F | Resp 20 | Ht 69.0 in | Wt 207.4 lb

## 2013-01-29 DIAGNOSIS — E785 Hyperlipidemia, unspecified: Secondary | ICD-10-CM | POA: Insufficient documentation

## 2013-01-29 DIAGNOSIS — I10 Essential (primary) hypertension: Secondary | ICD-10-CM | POA: Insufficient documentation

## 2013-01-29 DIAGNOSIS — Z8673 Personal history of transient ischemic attack (TIA), and cerebral infarction without residual deficits: Secondary | ICD-10-CM | POA: Insufficient documentation

## 2013-01-29 DIAGNOSIS — K219 Gastro-esophageal reflux disease without esophagitis: Secondary | ICD-10-CM | POA: Insufficient documentation

## 2013-01-29 DIAGNOSIS — C61 Malignant neoplasm of prostate: Secondary | ICD-10-CM

## 2013-01-29 DIAGNOSIS — Z79899 Other long term (current) drug therapy: Secondary | ICD-10-CM | POA: Insufficient documentation

## 2013-01-29 DIAGNOSIS — R339 Retention of urine, unspecified: Secondary | ICD-10-CM | POA: Insufficient documentation

## 2013-01-29 DIAGNOSIS — Z7982 Long term (current) use of aspirin: Secondary | ICD-10-CM | POA: Insufficient documentation

## 2013-01-29 DIAGNOSIS — Z87891 Personal history of nicotine dependence: Secondary | ICD-10-CM | POA: Insufficient documentation

## 2013-01-29 DIAGNOSIS — Z8042 Family history of malignant neoplasm of prostate: Secondary | ICD-10-CM | POA: Insufficient documentation

## 2013-01-29 DIAGNOSIS — R972 Elevated prostate specific antigen [PSA]: Secondary | ICD-10-CM

## 2013-01-29 HISTORY — DX: Allergy, unspecified, initial encounter: T78.40XA

## 2013-01-29 HISTORY — DX: Spondylosis without myelopathy or radiculopathy, lumbar region: M47.816

## 2013-01-29 HISTORY — DX: Other intervertebral disc degeneration, lumbosacral region: M51.37

## 2013-01-29 HISTORY — DX: Malignant neoplasm of prostate: C61

## 2013-01-29 HISTORY — DX: Other intervertebral disc degeneration, lumbosacral region without mention of lumbar back pain or lower extremity pain: M51.379

## 2013-01-29 HISTORY — DX: Personal history of other specified conditions: Z87.898

## 2013-01-29 HISTORY — DX: Umbilical hernia without obstruction or gangrene: K42.9

## 2013-01-29 HISTORY — DX: Long term (current) use of other agents affecting estrogen receptors and estrogen levels: Z79.818

## 2013-01-29 NOTE — Telephone Encounter (Signed)
Called patient to inform of gold seed placement on 04/16/13 at 1:00 pm at Dr[facility] and his sim on 05-04-13 at 11:00 am at Dr. Broadus John [facility], lvm for a return call

## 2013-01-29 NOTE — Telephone Encounter (Signed)
Patient 's gold seed appt. Has been arranged for 03-01-13 at 10:00 am at Dr[facility] Margrett Rud [facility], patient to arrive at 9:45 am and his sim will be on 03-09-13 at 11:00 am at Dr. Broadus John [facility].

## 2013-01-29 NOTE — Progress Notes (Signed)
Please see the Nurse Progress Note in the MD Initial Consult Encounter for this patient. 

## 2013-01-30 ENCOUNTER — Telehealth: Payer: Self-pay | Admitting: *Deleted

## 2013-01-30 NOTE — Telephone Encounter (Signed)
Eber Jones family member called asking to speak with Darryl Nestle stating we are  cancelling 05/07/13, patient will be out of town, and need to reschedule anytime from 05/07/13 on, will let Talbert Forest know 3:19 PM

## 2013-01-30 NOTE — Telephone Encounter (Signed)
CALLED PATIENT TO INFORM THAT SIM HAS BEEN MOVED TO 05-11-13 AT 11:00 AM, SPOKE WITH PATIENT'S WIFE AND THEY ARE AWARE OF THE APPT. CHANGE

## 2013-02-04 ENCOUNTER — Other Ambulatory Visit: Payer: Self-pay | Admitting: Internal Medicine

## 2013-02-24 ENCOUNTER — Other Ambulatory Visit: Payer: Self-pay | Admitting: Internal Medicine

## 2013-03-09 ENCOUNTER — Other Ambulatory Visit: Payer: Self-pay | Admitting: Radiation Oncology

## 2013-04-08 ENCOUNTER — Other Ambulatory Visit: Payer: Self-pay | Admitting: Internal Medicine

## 2013-05-04 ENCOUNTER — Other Ambulatory Visit: Payer: Self-pay | Admitting: Radiation Oncology

## 2013-05-07 ENCOUNTER — Telehealth: Payer: Self-pay | Admitting: Internal Medicine

## 2013-05-07 NOTE — Telephone Encounter (Signed)
Patients wife left message on triage requesting to schedule pt a follow up appointment with Dr. Jonny Ruiz (not a physical) and also to have his blood work done, patient is being treated for cancer at this time  Left message for pt to call back and schedule apppointment

## 2013-05-07 NOTE — Telephone Encounter (Signed)
Patient scheduled 05/08/13 @ 8:30am

## 2013-05-08 ENCOUNTER — Encounter: Payer: Self-pay | Admitting: Internal Medicine

## 2013-05-08 ENCOUNTER — Ambulatory Visit (INDEPENDENT_AMBULATORY_CARE_PROVIDER_SITE_OTHER): Payer: Medicare Other | Admitting: Internal Medicine

## 2013-05-08 ENCOUNTER — Other Ambulatory Visit (INDEPENDENT_AMBULATORY_CARE_PROVIDER_SITE_OTHER): Payer: Medicare Other

## 2013-05-08 VITALS — BP 130/70 | HR 81 | Temp 98.0°F | Ht 70.0 in | Wt 209.0 lb

## 2013-05-08 DIAGNOSIS — C61 Malignant neoplasm of prostate: Secondary | ICD-10-CM | POA: Insufficient documentation

## 2013-05-08 DIAGNOSIS — I10 Essential (primary) hypertension: Secondary | ICD-10-CM

## 2013-05-08 DIAGNOSIS — Z136 Encounter for screening for cardiovascular disorders: Secondary | ICD-10-CM

## 2013-05-08 DIAGNOSIS — Z0000 Encounter for general adult medical examination without abnormal findings: Secondary | ICD-10-CM

## 2013-05-08 DIAGNOSIS — R609 Edema, unspecified: Secondary | ICD-10-CM

## 2013-05-08 DIAGNOSIS — R7309 Other abnormal glucose: Secondary | ICD-10-CM

## 2013-05-08 DIAGNOSIS — R7302 Impaired glucose tolerance (oral): Secondary | ICD-10-CM

## 2013-05-08 DIAGNOSIS — M25569 Pain in unspecified knee: Secondary | ICD-10-CM

## 2013-05-08 DIAGNOSIS — M25562 Pain in left knee: Secondary | ICD-10-CM

## 2013-05-08 DIAGNOSIS — E785 Hyperlipidemia, unspecified: Secondary | ICD-10-CM

## 2013-05-08 LAB — URINALYSIS, ROUTINE W REFLEX MICROSCOPIC
Leukocytes, UA: NEGATIVE
Nitrite: NEGATIVE
RBC / HPF: NONE SEEN (ref 0–?)
Specific Gravity, Urine: 1.015 (ref 1.000–1.030)
Urobilinogen, UA: 0.2 (ref 0.0–1.0)
WBC, UA: NONE SEEN (ref 0–?)
pH: 6 (ref 5.0–8.0)

## 2013-05-08 LAB — LIPID PANEL
Cholesterol: 133 mg/dL (ref 0–200)
HDL: 53.5 mg/dL (ref >39.00)
Triglycerides: 28 mg/dL (ref 0.0–149.0)
VLDL: 5.6 mg/dL (ref 0.0–40.0)

## 2013-05-08 LAB — CBC WITH DIFFERENTIAL/PLATELET
Basophils Absolute: 0 10*3/uL (ref 0.0–0.1)
Eosinophils Relative: 2 % (ref 0.0–5.0)
HCT: 42.8 % (ref 39.0–52.0)
Hemoglobin: 14.9 g/dL (ref 13.0–17.0)
Lymphocytes Relative: 19 % (ref 12.0–46.0)
Lymphs Abs: 1.3 10*3/uL (ref 0.7–4.0)
Monocytes Relative: 13.7 % — ABNORMAL HIGH (ref 3.0–12.0)
Platelets: 212 10*3/uL (ref 150.0–400.0)
RDW: 14.7 % — ABNORMAL HIGH (ref 11.5–14.6)
WBC: 6.9 10*3/uL (ref 4.5–10.5)

## 2013-05-08 LAB — HEPATIC FUNCTION PANEL
AST: 26 U/L (ref 0–37)
Albumin: 3.8 g/dL (ref 3.5–5.2)
Alkaline Phosphatase: 75 U/L (ref 39–117)
Total Protein: 7 g/dL (ref 6.0–8.3)

## 2013-05-08 LAB — TSH: TSH: 1.74 u[IU]/mL (ref 0.35–5.50)

## 2013-05-08 LAB — BASIC METABOLIC PANEL
BUN: 19 mg/dL (ref 6–23)
CO2: 27 mEq/L (ref 19–32)
Calcium: 9.1 mg/dL (ref 8.4–10.5)
GFR: 71.53 mL/min (ref >60.00)
Glucose, Bld: 85 mg/dL (ref 70–99)

## 2013-05-08 NOTE — Assessment & Plan Note (Signed)
Mild right foot/ankle likely venous insuff - for elevatoin, low salt, compression stockings

## 2013-05-08 NOTE — Assessment & Plan Note (Signed)
Asymtp, for a1c 

## 2013-05-08 NOTE — Assessment & Plan Note (Signed)
Mild, benign exam, for ortho if returns

## 2013-05-08 NOTE — Patient Instructions (Signed)
Your EKG was ok today Please continue all other medications as before Please have the pharmacy call with any other refills you may need. You are otherwise up to date with prevention measures today. Please continue your efforts at being more active, low cholesterol diet, and weight control. Please contact your insurance and we can given the zostavax if it is covered, at your convenience Please go to the LAB in the Basement (turn left off the elevator) for the tests to be done today You will be contacted by phone if any changes need to be made immediately.  Otherwise, you will receive a letter about your results with an explanation Please remember to sign up for My Chart if you have not done so, as this will be important to you in the future with finding out test results, communicating by private email, and scheduling acute appointments online when needed. Please keep your appointments with your specialists as you have planned - urology and radiation therapy Please call if your left knee does not improve, for orthopedic referral  Please call for ENT referral if you want to look into the hearing problem further Please return in 1 year for your yearly visit, or sooner if needed, with Lab testing done 3-5 days before
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
## 2013-05-08 NOTE — Assessment & Plan Note (Addendum)

## 2013-05-10 ENCOUNTER — Telehealth: Payer: Self-pay | Admitting: *Deleted

## 2013-05-10 NOTE — Telephone Encounter (Signed)
Called patient to remind of appt. For 5-23- 14, lvm for a return call

## 2013-05-11 ENCOUNTER — Ambulatory Visit
Admission: RE | Admit: 2013-05-11 | Discharge: 2013-05-11 | Disposition: A | Discharge status: Home / Self Care | Payer: Medicare Other | Source: Ambulatory Visit | Attending: Radiation Oncology | Admitting: Radiation Oncology

## 2013-05-11 ENCOUNTER — Encounter: Payer: Self-pay | Admitting: Radiation Oncology

## 2013-05-11 DIAGNOSIS — C61 Malignant neoplasm of prostate: Secondary | ICD-10-CM | POA: Insufficient documentation

## 2013-05-11 DIAGNOSIS — Z510 Encounter for antineoplastic radiation therapy: Secondary | ICD-10-CM | POA: Insufficient documentation

## 2013-05-11 DIAGNOSIS — R351 Nocturia: Secondary | ICD-10-CM | POA: Insufficient documentation

## 2013-05-11 NOTE — Progress Notes (Signed)
  Radiation Oncology         (336) [phone] ________________________________  Name: Daniel Rowland MRN: [id number]  Date: 05/11/2013  DOB: 02/16/1935  SIMULATION AND TREATMENT PLANNING NOTE  DIAGNOSIS:  78 y.o. gentleman with stage T2a adenocarcinoma of the prostate with a Gleason's score of 4+3 and a PSA of 36.99  NARRATIVE:  The patient was brought to the CT Simulation planning suite.  Identity was confirmed.  All relevant records and images related to the planned course of therapy were reviewed.  The patient freely provided informed written consent to proceed with treatment after reviewing the details related to the planned course of therapy. The consent form was witnessed and verified by the simulation staff.  Then, the patient was set-up in a stable reproducible supine position for radiation therapy.  A vacuum lock pillow device was custom fabricated to position his legs in a reproducible immobilized position.  Then, I performed a urethrogram under sterile conditions to identify the prostatic apex.  CT images were obtained.  Surface markings were placed.  The CT images were loaded into the planning software.  Then the prostate target and avoidance structures including the rectum, bladder, bowel and hips were contoured.  Treatment planning then occurred.  The radiation prescription was entered and confirmed.  A total of one complex treatment device was fabricated. I have requested : Intensity Modulated Radiotherapy (IMRT) is medically necessary for this case for the following reason:  Rectal sparing.Marland Kitchen  PLAN:  The patient will receive 78 Gy in 40 fractions.  ________________________________  [name], M.D.

## 2013-05-11 NOTE — Progress Notes (Signed)
Gave RO billing sheet and business card to family member (daughter) that accompanied patient today.  Told her to relay to her father to call me with any questions or concerns.

## 2013-05-16 ENCOUNTER — Encounter: Payer: Self-pay | Admitting: *Deleted

## 2013-05-20 ENCOUNTER — Other Ambulatory Visit: Payer: Self-pay | Admitting: Internal Medicine

## 2013-05-23 ENCOUNTER — Ambulatory Visit: Payer: Medicare Other | Admitting: Radiation Oncology

## 2013-05-24 ENCOUNTER — Ambulatory Visit
Admission: RE | Admit: 2013-05-24 | Discharge: 2013-05-24 | Disposition: A | Discharge status: Home / Self Care | Payer: Medicare Other | Source: Ambulatory Visit | Attending: Radiation Oncology | Admitting: Radiation Oncology

## 2013-05-25 ENCOUNTER — Ambulatory Visit
Admission: RE | Admit: 2013-05-25 | Discharge: 2013-05-25 | Disposition: A | Discharge status: Home / Self Care | Payer: Medicare Other | Source: Ambulatory Visit | Attending: Radiation Oncology | Admitting: Radiation Oncology

## 2013-05-28 ENCOUNTER — Ambulatory Visit
Admission: RE | Admit: 2013-05-28 | Discharge: 2013-05-28 | Disposition: A | Discharge status: Home / Self Care | Payer: Medicare Other | Source: Ambulatory Visit | Attending: Radiation Oncology | Admitting: Radiation Oncology

## 2013-05-29 ENCOUNTER — Ambulatory Visit
Admission: RE | Admit: 2013-05-29 | Discharge: 2013-05-29 | Disposition: A | Discharge status: Home / Self Care | Payer: Medicare Other | Source: Ambulatory Visit | Attending: Radiation Oncology | Admitting: Radiation Oncology

## 2013-05-30 ENCOUNTER — Encounter: Payer: Self-pay | Admitting: Radiation Oncology

## 2013-05-30 ENCOUNTER — Ambulatory Visit
Admission: RE | Admit: 2013-05-30 | Discharge: 2013-05-30 | Disposition: A | Discharge status: Home / Self Care | Payer: Medicare Other | Source: Ambulatory Visit | Attending: Radiation Oncology | Admitting: Radiation Oncology

## 2013-05-30 VITALS — BP 140/67 | HR 76 | Temp 97.0°F | Resp 18 | Wt 208.4 lb

## 2013-05-30 DIAGNOSIS — C61 Malignant neoplasm of prostate: Secondary | ICD-10-CM

## 2013-05-30 NOTE — Progress Notes (Signed)
Reports voiding on average every three hours during the night. Reports a pressure or "twinge" when attempting to void. Denies hematuria. Denies difficulty completely emptying. Denies incontinence. Denies diarrhea or fatigue.

## 2013-05-30 NOTE — Progress Notes (Signed)
  Radiation Oncology         [phone] ________________________________  Name: Daniel Rowland MRN: [id number]  Date: 05/30/2013  DOB: [birth year]  Weekly Radiation Therapy Management  Current Dose: Reviewed/approved  Narrative . . . . . . . . The patient presents for routine under treatment assessment.                                                    The patient is without complaint.                                 Set-up films were reviewed.                                 The chart was checked. Physical Findings. . .  weight is 208 lb 6.4 oz (94.53 kg). His oral temperature is 97 F (36.1 C). His blood pressure is 140/67 and his pulse is 76. His respiration is 18 and oxygen saturation is 100%. . Weight essentially stable.  No significant changes. Impression . . . . . . . The patient is tolerating radiation. Plan . . . . . . . . . . . . Continue treatment as planned.  ________________________________  Artist Pais. Kathrynn Running, M.D.

## 2013-05-31 ENCOUNTER — Ambulatory Visit
Admission: RE | Admit: 2013-05-31 | Discharge: 2013-05-31 | Disposition: A | Discharge status: Home / Self Care | Payer: Medicare Other | Source: Ambulatory Visit | Attending: Radiation Oncology | Admitting: Radiation Oncology

## 2013-05-31 ENCOUNTER — Encounter: Payer: Self-pay | Admitting: Radiation Oncology

## 2013-05-31 ENCOUNTER — Other Ambulatory Visit: Payer: Self-pay | Admitting: Radiation Oncology

## 2013-05-31 DIAGNOSIS — C61 Malignant neoplasm of prostate: Secondary | ICD-10-CM

## 2013-05-31 MED ORDER — TAMSULOSIN HCL 0.4 MG PO CAPS: 0.4000 mg | ORAL_CAPSULE | Freq: Every day | ORAL | Status: DC

## 2013-05-31 NOTE — Addendum Note (Signed)
Encounter addended by: Agnes Lawrence, RN on: 05/31/2013 11:05 AM<BR>     Documentation filed: Visit Diagnoses, Inpatient Patient Education, Inpatient Document Flowsheet, Notes Section, Chief Complaint Section

## 2013-05-31 NOTE — Progress Notes (Signed)
  Radiation Oncology         [phone] ________________________________  Name: Daniel Rowland MRN: [id number]  Date: 05/31/2013  DOB: March 29, 1935  Weekly Radiation Therapy Management  Current Dose: Reviewed/Approved  Narrative . . . . . . . . The patient presents for routine under treatment assessment.                                                     He has outflow obstructive symptoms up to 6 x nocturia.                                 Set-up films were reviewed.                                 The chart was checked. Physical Findings. .  No significant changes. Impression . . . . . . . The patient is  tolerating radiation. Plan . . . . . . . . . . . . Continue treatment as planned.  Given Flomax  ________________________________  [name], M.D.

## 2013-05-31 NOTE — Progress Notes (Signed)
Late entry from 05/30/13 at 1122. Oriented patient to staff and routine of the clinic. Provided patient with RADIATION THERAPY AND YOU handbook then, reviewed pertinent information. Educated patient on potential side effects and management such as, fatigue, skin changes, urinary and bladder changes. All questions answered. Encouraged patient to call with needs. Patient verbalized understanding.

## 2013-06-01 ENCOUNTER — Ambulatory Visit
Admission: RE | Admit: 2013-06-01 | Discharge: 2013-06-01 | Disposition: A | Discharge status: Home / Self Care | Payer: Medicare Other | Source: Ambulatory Visit | Attending: Radiation Oncology | Admitting: Radiation Oncology

## 2013-06-01 ENCOUNTER — Inpatient Hospital Stay: Admission: RE | Admit: 2013-06-01 | Payer: Medicare Other | Source: Ambulatory Visit | Admitting: Radiation Oncology

## 2013-06-04 ENCOUNTER — Ambulatory Visit
Admission: RE | Admit: 2013-06-04 | Discharge: 2013-06-04 | Disposition: A | Discharge status: Home / Self Care | Payer: Medicare Other | Source: Ambulatory Visit | Attending: Radiation Oncology | Admitting: Radiation Oncology

## 2013-06-05 ENCOUNTER — Ambulatory Visit
Admission: RE | Admit: 2013-06-05 | Discharge: 2013-06-05 | Disposition: A | Discharge status: Home / Self Care | Payer: Medicare Other | Source: Ambulatory Visit | Attending: Radiation Oncology | Admitting: Radiation Oncology

## 2013-06-06 ENCOUNTER — Ambulatory Visit
Admission: RE | Admit: 2013-06-06 | Discharge: 2013-06-06 | Disposition: A | Discharge status: Home / Self Care | Payer: Medicare Other | Source: Ambulatory Visit | Attending: Radiation Oncology | Admitting: Radiation Oncology

## 2013-06-07 ENCOUNTER — Encounter: Payer: Self-pay | Admitting: Radiation Oncology

## 2013-06-07 ENCOUNTER — Ambulatory Visit
Admission: RE | Admit: 2013-06-07 | Discharge: 2013-06-07 | Disposition: A | Discharge status: Home / Self Care | Payer: Medicare Other | Source: Ambulatory Visit | Attending: Radiation Oncology | Admitting: Radiation Oncology

## 2013-06-07 VITALS — BP 155/90 | HR 97 | Resp 18 | Wt 212.3 lb

## 2013-06-07 DIAGNOSIS — C61 Malignant neoplasm of prostate: Secondary | ICD-10-CM

## 2013-06-07 NOTE — Progress Notes (Signed)
Concerned about pitting +1 edema bilateral lower extremities. Advised patient to contact PCP, keep feet elevated, and avoid salt. Patient verbalized understanding. Elevated bp noted. Explained correlation to the patient. Reports difficulty sleeping during the night. Reports he got up at 0400 and could not go to sleep. Reports getting up three times during the night to void. Reports taking flomax as directed by Dr. Kathrynn Running and that "it works good. " Reports a strong urine stream. Denies burning with urination. Denies hematuria.

## 2013-06-07 NOTE — Progress Notes (Signed)
  Radiation Oncology         [phone] ________________________________  Name: Daniel Rowland MRN: [id number]  Date: 06/07/2013  DOB: 1935-06-24  Weekly Radiation Therapy Management  Current Dose: 21.45 Gy     Planned Dose:  78 Gy  Narrative . . . . . . . . The patient presents for routine under treatment assessment.                                                      Nocturia better with Flomax.  The patient is without complaint.                                 Set-up films were reviewed.                                 The chart was checked. Physical Findings. . .  weight is 212 lb 4.8 oz (96.299 kg). His blood pressure is 155/90 and his pulse is 97. His respiration is 18. . Weight essentially stable.  No significant changes. Impression . . . . . . . The patient is tolerating radiation. Plan . . . . . . . . . . . . Continue treatment as planned.  ________________________________  Artist Pais. Kathrynn Running, M.D.

## 2013-06-08 ENCOUNTER — Ambulatory Visit
Admission: RE | Admit: 2013-06-08 | Discharge: 2013-06-08 | Disposition: A | Discharge status: Home / Self Care | Payer: Medicare Other | Source: Ambulatory Visit | Attending: Radiation Oncology | Admitting: Radiation Oncology

## 2013-06-11 ENCOUNTER — Ambulatory Visit
Admission: RE | Admit: 2013-06-11 | Discharge: 2013-06-11 | Disposition: A | Discharge status: Home / Self Care | Payer: Medicare Other | Source: Ambulatory Visit | Attending: Radiation Oncology | Admitting: Radiation Oncology

## 2013-06-12 ENCOUNTER — Ambulatory Visit
Admission: RE | Admit: 2013-06-12 | Discharge: 2013-06-12 | Disposition: A | Discharge status: Home / Self Care | Payer: Medicare Other | Source: Ambulatory Visit | Attending: Radiation Oncology | Admitting: Radiation Oncology

## 2013-06-13 ENCOUNTER — Encounter: Payer: Self-pay | Admitting: Radiation Oncology

## 2013-06-13 ENCOUNTER — Ambulatory Visit
Admission: RE | Admit: 2013-06-13 | Discharge: 2013-06-13 | Disposition: A | Discharge status: Home / Self Care | Payer: Medicare Other | Source: Ambulatory Visit | Attending: Radiation Oncology | Admitting: Radiation Oncology

## 2013-06-13 VITALS — BP 132/77 | HR 78 | Resp 16 | Wt 210.1 lb

## 2013-06-13 DIAGNOSIS — C61 Malignant neoplasm of prostate: Secondary | ICD-10-CM

## 2013-06-13 NOTE — Progress Notes (Signed)
Patient hard of hearing. Reports getting up 2-3 times per night to void. Denies burning with urination or hematuria. Reports a weak urine stream. Reports diarrhea resolves with Imodium. Reports taking flomax as directed.

## 2013-06-13 NOTE — Progress Notes (Signed)
  Radiation Oncology         [phone] ________________________________  Name: Daniel Rowland MRN: [id number]  Date: 06/13/2013  DOB: 29-May-1935  Weekly Radiation Therapy Management  Current Dose: 29.25 Gy     Planned Dose:  78 Gy  Narrative . . . . . . . . The patient presents for routine under treatment assessment.                                                      The patient is without complaint.                                 Set-up films were reviewed.                                 The chart was checked. Physical Findings. . .  weight is 210 lb 1.6 oz (95.301 kg). His blood pressure is 132/77 and his pulse is 78. His respiration is 16. . Weight essentially stable.  No significant changes. Impression . . . . . . . The patient is  tolerating radiation. Plan . . . . . . . . . . . . Continue treatment as planned.  ________________________________  Artist Pais. Kathrynn Running, M.D.

## 2013-06-14 ENCOUNTER — Ambulatory Visit
Admission: RE | Admit: 2013-06-14 | Discharge: 2013-06-14 | Disposition: A | Discharge status: Home / Self Care | Payer: Medicare Other | Source: Ambulatory Visit | Attending: Radiation Oncology | Admitting: Radiation Oncology

## 2013-06-15 ENCOUNTER — Ambulatory Visit
Admission: RE | Admit: 2013-06-15 | Discharge: 2013-06-15 | Disposition: A | Discharge status: Home / Self Care | Payer: Medicare Other | Source: Ambulatory Visit | Attending: Radiation Oncology | Admitting: Radiation Oncology

## 2013-06-18 ENCOUNTER — Ambulatory Visit
Admission: RE | Admit: 2013-06-18 | Discharge: 2013-06-18 | Disposition: A | Discharge status: Home / Self Care | Payer: Medicare Other | Source: Ambulatory Visit | Attending: Radiation Oncology | Admitting: Radiation Oncology

## 2013-06-19 ENCOUNTER — Ambulatory Visit
Admission: RE | Admit: 2013-06-19 | Discharge: 2013-06-19 | Disposition: A | Discharge status: Home / Self Care | Payer: Medicare Other | Source: Ambulatory Visit | Attending: Radiation Oncology | Admitting: Radiation Oncology

## 2013-06-20 ENCOUNTER — Ambulatory Visit
Admission: RE | Admit: 2013-06-20 | Discharge: 2013-06-20 | Disposition: A | Discharge status: Home / Self Care | Payer: Medicare Other | Source: Ambulatory Visit | Attending: Radiation Oncology | Admitting: Radiation Oncology

## 2013-06-20 ENCOUNTER — Encounter: Payer: Self-pay | Admitting: Radiation Oncology

## 2013-06-20 VITALS — Resp 16 | Wt 209.1 lb

## 2013-06-20 DIAGNOSIS — C61 Malignant neoplasm of prostate: Secondary | ICD-10-CM

## 2013-06-20 NOTE — Progress Notes (Signed)
Patient hard of hearing. Reports getting up 2-3 times per night to void. Denies burning with urination or hematuria. Reports his urine stream alternates from strong to weak. Reports diarrhea resolves with Imodium. Reports taking flomax as directed.

## 2013-06-20 NOTE — Progress Notes (Signed)
  Radiation Oncology         [phone] ________________________________  Name: Sagar Tengan Fye MRN: [id number]  Date: 06/20/2013  DOB: 1935/10/22  Weekly Radiation Therapy Management  Current Dose: 39 Gy     Planned Dose:  78 Gy  Narrative . . . . . . . . The patient presents for routine under treatment assessment.                                                    Patient hard of hearing. Reports getting up 2-3 times per night to void. Denies burning with urination or hematuria. Reports his urine stream alternates from strong to weak. Reports diarrhea resolves with Imodium. Reports taking flomax as directed                                 Set-up films were reviewed.                                 The chart was checked. Physical Findings. . .  weight is 209 lb 1.6 oz (94.847 kg). His respiration is 16. . Weight essentially stable.  No significant changes. Impression . . . . . . . The patient is tolerating radiation. Plan . . . . . . . . . . . . Continue treatment as planned.  ________________________________  Artist Pais. Kathrynn Running, M.D.

## 2013-06-21 ENCOUNTER — Ambulatory Visit
Admission: RE | Admit: 2013-06-21 | Discharge: 2013-06-21 | Disposition: A | Discharge status: Home / Self Care | Payer: Medicare Other | Source: Ambulatory Visit | Attending: Radiation Oncology | Admitting: Radiation Oncology

## 2013-06-25 ENCOUNTER — Ambulatory Visit
Admission: RE | Admit: 2013-06-25 | Discharge: 2013-06-25 | Disposition: A | Discharge status: Home / Self Care | Payer: Medicare Other | Source: Ambulatory Visit | Attending: Radiation Oncology | Admitting: Radiation Oncology

## 2013-06-26 ENCOUNTER — Ambulatory Visit
Admission: RE | Admit: 2013-06-26 | Discharge: 2013-06-26 | Disposition: A | Discharge status: Home / Self Care | Payer: Medicare Other | Source: Ambulatory Visit | Attending: Radiation Oncology | Admitting: Radiation Oncology

## 2013-06-27 ENCOUNTER — Ambulatory Visit
Admission: RE | Admit: 2013-06-27 | Discharge: 2013-06-27 | Disposition: A | Discharge status: Home / Self Care | Payer: Medicare Other | Source: Ambulatory Visit | Attending: Radiation Oncology | Admitting: Radiation Oncology

## 2013-06-27 ENCOUNTER — Telehealth: Payer: Self-pay | Admitting: *Deleted

## 2013-06-27 NOTE — Telephone Encounter (Signed)
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  Gave to [employer]

## 2013-06-28 ENCOUNTER — Ambulatory Visit
Admission: RE | Admit: 2013-06-28 | Discharge: 2013-06-28 | Disposition: A | Discharge status: Home / Self Care | Payer: Medicare Other | Source: Ambulatory Visit | Attending: Radiation Oncology | Admitting: Radiation Oncology

## 2013-06-29 ENCOUNTER — Ambulatory Visit
Admission: RE | Admit: 2013-06-29 | Discharge: 2013-06-29 | Disposition: A | Discharge status: Home / Self Care | Payer: Medicare Other | Source: Ambulatory Visit | Attending: Radiation Oncology | Admitting: Radiation Oncology

## 2013-06-29 ENCOUNTER — Encounter: Payer: Self-pay | Admitting: Radiation Oncology

## 2013-06-29 VITALS — BP 135/68 | HR 86 | Resp 16 | Wt 210.1 lb

## 2013-06-29 DIAGNOSIS — C61 Malignant neoplasm of prostate: Secondary | ICD-10-CM

## 2013-06-29 NOTE — Progress Notes (Signed)
Biggest complaint today is fatigue. Denies burning with urination or difficulty emptying his bladder. Reports taking flomax and pyridium as directed. Reports getting up 2-3 times per night to void. Weight stable.

## 2013-06-29 NOTE — Progress Notes (Signed)
  Radiation Oncology         [phone] ________________________________  Name: Daniel Rowland MRN: [id number]  Date: 06/29/2013  DOB: 07-19-1935  Weekly Radiation Therapy Management  Current Dose: 50.7 Gy     Planned Dose:  78 Gy  Narrative . . . . . . . . The patient presents for routine under treatment assessment.                                                     The patient is without complaint.                                 Set-up films were reviewed.                                 The chart was checked. Physical Findings. . .  weight is 210 lb 1.6 oz (95.301 kg). His blood pressure is 135/68 and his pulse is 86. His respiration is 16. . Weight essentially stable.  No significant changes. Impression . . . . . . . The patient is  tolerating radiation. Plan . . . . . . . . . . . . Continue treatment as planned.  ________________________________  Artist Pais. Kathrynn Running, M.D.

## 2013-07-02 ENCOUNTER — Ambulatory Visit
Admission: RE | Admit: 2013-07-02 | Discharge: 2013-07-02 | Disposition: A | Discharge status: Home / Self Care | Payer: Medicare Other | Source: Ambulatory Visit | Attending: Radiation Oncology | Admitting: Radiation Oncology

## 2013-07-03 ENCOUNTER — Ambulatory Visit
Admission: RE | Admit: 2013-07-03 | Discharge: 2013-07-03 | Disposition: A | Discharge status: Home / Self Care | Payer: Medicare Other | Source: Ambulatory Visit | Attending: Radiation Oncology | Admitting: Radiation Oncology

## 2013-07-03 ENCOUNTER — Telehealth: Payer: Self-pay | Admitting: Radiation Oncology

## 2013-07-03 NOTE — Telephone Encounter (Signed)
Placed purple folder with [employer] paper work on Dr. Broadus John desk for completion.

## 2013-07-04 ENCOUNTER — Ambulatory Visit
Admission: RE | Admit: 2013-07-04 | Discharge: 2013-07-04 | Disposition: A | Discharge status: Home / Self Care | Payer: Medicare Other | Source: Ambulatory Visit | Attending: Radiation Oncology | Admitting: Radiation Oncology

## 2013-07-05 ENCOUNTER — Ambulatory Visit
Admission: RE | Admit: 2013-07-05 | Discharge: 2013-07-05 | Disposition: A | Discharge status: Home / Self Care | Payer: Medicare Other | Source: Ambulatory Visit | Attending: Radiation Oncology | Admitting: Radiation Oncology

## 2013-07-05 ENCOUNTER — Telehealth: Payer: Self-pay | Admitting: Radiation Oncology

## 2013-07-05 ENCOUNTER — Encounter: Payer: Self-pay | Admitting: Radiation Oncology

## 2013-07-05 VITALS — BP 134/81 | HR 62 | Resp 18 | Wt 207.1 lb

## 2013-07-05 DIAGNOSIS — C61 Malignant neoplasm of prostate: Secondary | ICD-10-CM

## 2013-07-05 NOTE — Progress Notes (Signed)
  Radiation Oncology         [phone] ________________________________  Name: Daniel Rowland MRN: [id number]  Date: 07/05/2013  DOB: 1935-07-23  Weekly Radiation Therapy Management  Current Dose: 58.5 Gy     Planned Dose:  78 Gy  Narrative . . . . . . . . The patient presents for routine under treatment assessment.          I                                  Question when he should or if he should take his next Lupron injection since in one week it will be six months since his last one. Denies pain today. Reports taking flomax as directed. Denies difficulty emptying his bladder. Reports his urine stream alternates between weak and strong. Denies burning with urination. Reports on average he gets up three times per night to void. Reports occasional diarrhea for which imodium resolves. Reports fatigue                                 Set-up films were reviewed.                                 The chart was checked. Physical Findings. . .  weight is 207 lb 1.6 oz (93.94 kg). His blood pressure is 134/81 and his pulse is 62. His respiration is 18. . Weight essentially stable.  No significant changes. Impression . . . . . . . The patient is tolerating radiation. Plan . . . . . . . . . . . . Continue treatment as planned.  ________________________________  Artist Pais. Kathrynn Running, M.D.

## 2013-07-05 NOTE — Telephone Encounter (Signed)
Mailed [employer] disability forms to patient.

## 2013-07-05 NOTE — Telephone Encounter (Signed)
Returned purple folder with completed [employer] disability paperwork to [facility].

## 2013-07-05 NOTE — Progress Notes (Signed)
Question when he should or if he should take his next Lupron injection since in one week it will be six months since his last one. Denies pain today. Reports taking flomax as directed. Denies difficulty emptying his bladder. Reports his urine stream alternates between weak and strong. Denies burning with urination. Reports on average he gets up three times per night to void. Reports occasional diarrhea for which imodium resolves. Reports fatigue.

## 2013-07-06 ENCOUNTER — Ambulatory Visit
Admission: RE | Admit: 2013-07-06 | Discharge: 2013-07-06 | Disposition: A | Discharge status: Home / Self Care | Payer: Medicare Other | Source: Ambulatory Visit | Attending: Radiation Oncology | Admitting: Radiation Oncology

## 2013-07-09 ENCOUNTER — Ambulatory Visit
Admission: RE | Admit: 2013-07-09 | Discharge: 2013-07-09 | Disposition: A | Discharge status: Home / Self Care | Payer: Medicare Other | Source: Ambulatory Visit | Attending: Radiation Oncology | Admitting: Radiation Oncology

## 2013-07-09 ENCOUNTER — Ambulatory Visit: Payer: Medicare Other | Admitting: Radiation Oncology

## 2013-07-10 ENCOUNTER — Ambulatory Visit
Admission: RE | Admit: 2013-07-10 | Discharge: 2013-07-10 | Disposition: A | Discharge status: Home / Self Care | Payer: Medicare Other | Source: Ambulatory Visit | Attending: Radiation Oncology | Admitting: Radiation Oncology

## 2013-07-11 ENCOUNTER — Ambulatory Visit
Admission: RE | Admit: 2013-07-11 | Discharge: 2013-07-11 | Disposition: A | Discharge status: Home / Self Care | Payer: Medicare Other | Source: Ambulatory Visit | Attending: Radiation Oncology | Admitting: Radiation Oncology

## 2013-07-12 ENCOUNTER — Ambulatory Visit
Admission: RE | Admit: 2013-07-12 | Discharge: 2013-07-12 | Disposition: A | Discharge status: Home / Self Care | Payer: Medicare Other | Source: Ambulatory Visit | Attending: Radiation Oncology | Admitting: Radiation Oncology

## 2013-07-12 ENCOUNTER — Encounter: Payer: Self-pay | Admitting: Radiation Oncology

## 2013-07-12 VITALS — BP 122/80 | HR 73 | Resp 16 | Wt 208.0 lb

## 2013-07-12 DIAGNOSIS — C61 Malignant neoplasm of prostate: Secondary | ICD-10-CM

## 2013-07-12 NOTE — Progress Notes (Signed)
  Radiation Oncology         [phone] ________________________________  Name: Daniel Rowland MRN: [id number]  Date: 07/12/2013  DOB: 1935-10-06  Weekly Radiation Therapy Management  Current Dose: 68.25 Gy     Planned Dose:  78 Gy  Narrative . . . . . . . . The patient presents for routine under treatment assessment.                                                      The patient is without complaint.                                 Set-up films were reviewed.                                 The chart was checked. Physical Findings. . .  weight is 208 lb (94.348 kg). His blood pressure is 122/80 and his pulse is 73. His respiration is 16. . Weight essentially stable.  No significant changes. Impression . . . . . . . The patient is  tolerating radiation. Plan . . . . . . . . . . . . Continue treatment as planned.  ________________________________  Artist Pais. Kathrynn Running, M.D.

## 2013-07-12 NOTE — Progress Notes (Signed)
Denies dysuria or hematuria. Reports diarrhea once last week for which imodium resolved. Reports on average he voids 3 times per night. Reports only mild fatigue.

## 2013-07-13 ENCOUNTER — Ambulatory Visit
Admission: RE | Admit: 2013-07-13 | Discharge: 2013-07-13 | Disposition: A | Discharge status: Home / Self Care | Payer: Medicare Other | Source: Ambulatory Visit | Attending: Radiation Oncology | Admitting: Radiation Oncology

## 2013-07-16 ENCOUNTER — Ambulatory Visit
Admission: RE | Admit: 2013-07-16 | Discharge: 2013-07-16 | Disposition: A | Discharge status: Home / Self Care | Payer: Medicare Other | Source: Ambulatory Visit | Attending: Radiation Oncology | Admitting: Radiation Oncology

## 2013-07-17 ENCOUNTER — Ambulatory Visit
Admission: RE | Admit: 2013-07-17 | Discharge: 2013-07-17 | Disposition: A | Discharge status: Home / Self Care | Payer: Medicare Other | Source: Ambulatory Visit | Attending: Radiation Oncology | Admitting: Radiation Oncology

## 2013-07-18 ENCOUNTER — Ambulatory Visit
Admission: RE | Admit: 2013-07-18 | Discharge: 2013-07-18 | Disposition: A | Discharge status: Home / Self Care | Payer: Medicare Other | Source: Ambulatory Visit | Attending: Radiation Oncology | Admitting: Radiation Oncology

## 2013-07-19 ENCOUNTER — Encounter: Payer: Self-pay | Admitting: Radiation Oncology

## 2013-07-19 ENCOUNTER — Ambulatory Visit
Admission: RE | Admit: 2013-07-19 | Discharge: 2013-07-19 | Disposition: A | Discharge status: Home / Self Care | Payer: Medicare Other | Source: Ambulatory Visit | Attending: Radiation Oncology | Admitting: Radiation Oncology

## 2013-07-19 VITALS — BP 131/66 | HR 77 | Resp 16 | Wt 209.6 lb

## 2013-07-19 DIAGNOSIS — C61 Malignant neoplasm of prostate: Secondary | ICD-10-CM

## 2013-07-19 NOTE — Progress Notes (Signed)
   Department of Radiation Oncology  Phone:  [phone] Fax:        [phone]  Weekly Treatment Note    Name: Daniel Rowland Date: 07/19/2013 MRN: [id number] DOB: 1935-11-11   Current dose: 78 Gy  Current fraction: 40   MEDICATIONS: Current Outpatient Prescriptions  Medication Sig Dispense Refill  . aspirin EC 81 MG tablet Take 81 mg by mouth daily.      Marland Kitchen esomeprazole (NEXIUM) 40 MG capsule Take 1 capsule (40 mg total) by mouth daily.  90 capsule  3  . Fesoterodine Fumarate (TOVIAZ) 8 MG TB24 Take 1 tablet (8 mg total) by mouth daily.  90 tablet  3  . Leuprolide Acetate, 6 Month, (LUPRON DEPOT) 45 MG injection Inject 45 mg into the muscle every 6 (six) months.      . lovastatin (MEVACOR) 40 MG tablet TAKE 1 TABLET EVERY DAY  30 tablet  0  . lovastatin (MEVACOR) 40 MG tablet TAKE 1 TABLET EVERY DAY  90 tablet  3  . Multiple Vitamins-Minerals (CENTRUM SILVER ULTRA MENS) TABS Take 1 tablet by mouth daily.      Marland Kitchen NEXIUM 40 MG capsule TAKE 1 CAPSULE EVERY DAY  90 capsule  3  . phenazopyridine (PYRIDIUM) 200 MG tablet Take 200 mg by mouth every 6 (six) hours as needed.      . tamsulosin (FLOMAX) 0.4 MG CAPS Take 1 capsule (0.4 mg total) by mouth daily after supper.  30 capsule  5  . vitamin B-12 (CYANOCOBALAMIN) 1000 MCG tablet Take 1,000 mcg by mouth daily.       No current facility-administered medications for this encounter.     ALLERGIES: Sulfa antibiotics   LABORATORY DATA:  Lab Results  Component Value Date   WBC 6.9 05/08/2013   HGB 14.9 05/08/2013   HCT 42.8 05/08/2013   MCV 90.2 05/08/2013   PLT 212.0 05/08/2013   Lab Results  Component Value Date   NA 139 05/08/2013   K 3.5 05/08/2013   CL 106 05/08/2013   CO2 27 05/08/2013   Lab Results  Component Value Date   ALT 20 05/08/2013   AST 26 05/08/2013   ALKPHOS 75 05/08/2013   BILITOT 0.9 05/08/2013     NARRATIVE: Daniel Rowland was seen today for weekly treatment management. The chart was checked and the  patient's films were reviewed. The patient his final fraction of radiotherapy today. He has done well during treatment. He has had a little bit of loose stools which have been well controlled with Imodium. He has had a moderate change in his urination with nocturia 2-3 times per night. This has been stable recently.  PHYSICAL EXAMINATION: weight is 209 lb 9.6 oz (95.074 kg). His blood pressure is 131/66 and his pulse is 77. His respiration is 16.        ASSESSMENT: The patient did satisfactorily with treatment.  PLAN: Followup with Dr. Kathrynn Running in one month.

## 2013-07-19 NOTE — Progress Notes (Signed)
Complete radiation treatment today for prostate cancer. Reports on average he gets up 2-3 times per night to void. Denies hematuria or dysuria. Reports two episodes of diarrhea per day. Denies blood in stool. Reports urine stream alternate between strong and weak. Reports mild fatigue. Provided patient with appointment card for one month follow up and encouraged to call with needs.

## 2013-07-23 NOTE — Progress Notes (Signed)
  Radiation Oncology         [phone] ________________________________  Name: Daniel Rowland MRN: [id number]  Date: 07/19/2013  DOB: [birth year]  End of Treatment Note  Diagnosis:  78 y.o. gentleman with stage T2a adenocarcinoma of the prostate with a Gleason's score of 4+3 and a PSA of 36.99  Indication for treatment:  Curative       Radiation treatment dates:   05/24/2013-07/19/2013  Site/dose:   The prostate was treated to 78 gray in 40 fractions of 1.95 gray  Beams/energy:   The patient was treated with intensity modulated radiotherapy using volumetric ARC therapy with to rapid arcs. He was positioned daily with a body fix bag immobilizing his legs. Image guidance was performed with daily cone beam CT to assess the position of his gold fiducial markers within the prostate and a further assess bladder and rectal filling on a day-to-day basis.  Narrative: The patient tolerated radiation treatment relatively well.   He experienced nocturia 2-3 times per night. He did not experience any dysuria. He reported some softening of stools with 2 bowel movements daily but no diarrhea per se.  Plan: The patient has completed radiation treatment. The patient will return to radiation oncology clinic for routine followup in one month. I advised them to call or return sooner if they have any questions or concerns related to their recovery or treatment. ________________________________  Artist Pais. Kathrynn Running, M.D.

## 2013-08-14 ENCOUNTER — Telehealth: Payer: Self-pay | Admitting: Radiation Oncology

## 2013-08-14 NOTE — Telephone Encounter (Signed)
Placed purple folder with [employer] paperwork that needs signing in Dr. Broadus John inbox.

## 2013-08-22 ENCOUNTER — Encounter: Payer: Self-pay | Admitting: Radiation Oncology

## 2013-08-22 NOTE — Progress Notes (Signed)
Mailed disability forms to patient at his request.

## 2013-08-26 ENCOUNTER — Other Ambulatory Visit: Payer: Self-pay | Admitting: Internal Medicine

## 2013-08-30 ENCOUNTER — Telehealth: Payer: Self-pay | Admitting: Radiation Oncology

## 2013-08-30 ENCOUNTER — Encounter: Payer: Self-pay | Admitting: Radiation Oncology

## 2013-08-30 ENCOUNTER — Ambulatory Visit
Admission: RE | Admit: 2013-08-30 | Discharge: 2013-08-30 | Disposition: A | Discharge status: Home / Self Care | Payer: Medicare Other | Source: Ambulatory Visit | Attending: Radiation Oncology | Admitting: Radiation Oncology

## 2013-08-30 ENCOUNTER — Telehealth: Payer: Self-pay | Admitting: *Deleted

## 2013-08-30 VITALS — BP 132/79 | HR 85 | Temp 98.0°F | Resp 16 | Wt 209.8 lb

## 2013-08-30 DIAGNOSIS — C61 Malignant neoplasm of prostate: Secondary | ICD-10-CM

## 2013-08-30 NOTE — Telephone Encounter (Signed)
Opened in error

## 2013-08-30 NOTE — Progress Notes (Signed)
Continues to take flomax. Reports on average he voids 2-3 times per night. Denies dysuria or hematuria Reports soft stools but, denies diarrhea, blood in stool or pain associated with bowel movements. Reports his urine stream alternates between weak and strong. Denies having to strain to urinate or difficulty emptying. Denies nausea, vomiting, headache, dizziness, night sweats or weight loss.

## 2013-08-30 NOTE — Telephone Encounter (Signed)
CALLED PATIENT TO INFORM OF FU VISIT WITH DR. Vernie Ammons ON 09-04-13 AT 12:30 PM, LVM FOR A RETURN CALL

## 2013-08-30 NOTE — Progress Notes (Signed)
Radiation Oncology         [phone] ________________________________  Name: Daniel Rowland MRN: [id number]  Date: 08/30/2013  DOB: 12-14-1935  Follow-Up Visit Note  CC: Oliver Barre, MD  Corwin Levins, MD  Diagnosis:   78 y.o. gentleman with stage T2a adenocarcinoma of the prostate with a Gleason's score of 4+3 and a PSA of 36.99 s/p Curative IMRT  05/24/2013-07/19/2013  to 78 gray in 40 fractions   Interval Since Last Radiation:  4  weeks  Narrative:  The patient returns today for routine follow-up.  Continues to take Toviaz during the day. Reports on average he voids 2-3 times per night. Denies dysuria or hematuria Reports soft stools but, denies diarrhea, blood in stool or pain associated with bowel movements. Reports his urine stream alternates between weak and strong. Denies having to strain to urinate or difficulty emptying. Denies nausea, vomiting, headache, dizziness, night sweats or weight loss                              ALLERGIES:  is allergic to sulfa antibiotics.  Meds: Current Outpatient Prescriptions  Medication Sig Dispense Refill  . aspirin EC 81 MG tablet Take 81 mg by mouth daily.      Marland Kitchen esomeprazole (NEXIUM) 40 MG capsule Take 1 capsule (40 mg total) by mouth daily.  90 capsule  3  . Fesoterodine Fumarate (TOVIAZ) 8 MG TB24 Take 1 tablet (8 mg total) by mouth daily.  90 tablet  3  . Leuprolide Acetate, 6 Month, (LUPRON DEPOT) 45 MG injection Inject 45 mg into the muscle every 6 (six) months.      . lovastatin (MEVACOR) 40 MG tablet TAKE 1 TABLET EVERY DAY  30 tablet  0  . lovastatin (MEVACOR) 40 MG tablet TAKE 1 TABLET EVERY DAY  90 tablet  3  . Multiple Vitamins-Minerals (CENTRUM SILVER ULTRA MENS) TABS Take 1 tablet by mouth daily.      Marland Kitchen NEXIUM 40 MG capsule TAKE 1 CAPSULE EVERY DAY  90 capsule  3  . NEXIUM 40 MG capsule TAKE 1 CAPSULE EVERY DAY  90 capsule  2  . tamsulosin (FLOMAX) 0.4 MG CAPS Take 1 capsule (0.4 mg total) by mouth daily after supper.  30  capsule  5  . vitamin B-12 (CYANOCOBALAMIN) 1000 MCG tablet Take 1,000 mcg by mouth daily.      . phenazopyridine (PYRIDIUM) 200 MG tablet Take 200 mg by mouth every 6 (six) hours as needed.       No current facility-administered medications for this encounter.    Physical Findings: The patient is in no acute distress. Patient is alert and oriented.  weight is 209 lb 12.8 oz (95.165 kg). His oral temperature is 98 F (36.7 C). His blood pressure is 132/79 and his pulse is 85. His respiration is 16 and oxygen saturation is 100%. .  No significant changes.  Lab Findings: Lab Results  Component Value Date   WBC 6.9 05/08/2013   HGB 14.9 05/08/2013   HCT 42.8 05/08/2013   MCV 90.2 05/08/2013   PLT 212.0 05/08/2013   Impression:  The patient is recovering from the effects of radiation.    Plan:  He will continue to follow-up with urology for ongoing PSA determinations.  I will look forward to following his response through their correspondence, and be happy to participate in care if clinically indicated.  I talked to the patient about  what to expect in the future, including his risk for erectile dysfunction and rectal bleeding.  I encouraged him to call or return to the office if he has any question about his previous radiation or possible radiation effects.  He was comfortable with this plan.  _____________________________________  Artist Pais. Kathrynn Running, M.D.

## 2013-09-04 ENCOUNTER — Telehealth: Payer: Self-pay | Admitting: Radiation Oncology

## 2013-09-04 NOTE — Telephone Encounter (Signed)
Placed [employer] paperwork on Dr. Broadus John desk to complete question 6e.

## 2013-09-06 ENCOUNTER — Telehealth: Payer: Self-pay | Admitting: Radiation Oncology

## 2013-09-06 NOTE — Telephone Encounter (Signed)
Received [employer] paperwork for a fourth time. Phoned wife for more clarification as to needs and expectations for paperwork. She requested that only question 6e be completed. Completed 6e as requested. Paperwork signed by Dr. Kathrynn Running. Paperwork given to Albin Felling to scan and mail back to patient.

## 2013-09-18 ENCOUNTER — Ambulatory Visit (INDEPENDENT_AMBULATORY_CARE_PROVIDER_SITE_OTHER): Payer: Medicare Other

## 2013-09-18 DIAGNOSIS — Z23 Encounter for immunization: Secondary | ICD-10-CM

## 2014-06-24 ENCOUNTER — Other Ambulatory Visit: Payer: Self-pay | Admitting: Internal Medicine

## 2014-09-10 ENCOUNTER — Ambulatory Visit (INDEPENDENT_AMBULATORY_CARE_PROVIDER_SITE_OTHER): Payer: Medicare Other

## 2014-09-10 DIAGNOSIS — Z23 Encounter for immunization: Secondary | ICD-10-CM

## 2014-10-16 ENCOUNTER — Encounter: Payer: Self-pay | Admitting: Internal Medicine

## 2014-10-16 ENCOUNTER — Ambulatory Visit (INDEPENDENT_AMBULATORY_CARE_PROVIDER_SITE_OTHER): Payer: Medicare Other | Admitting: Internal Medicine

## 2014-10-16 ENCOUNTER — Other Ambulatory Visit (INDEPENDENT_AMBULATORY_CARE_PROVIDER_SITE_OTHER): Payer: Medicare Other

## 2014-10-16 VITALS — BP 142/72 | HR 86 | Temp 98.0°F | Ht 69.0 in | Wt 209.5 lb

## 2014-10-16 DIAGNOSIS — E785 Hyperlipidemia, unspecified: Secondary | ICD-10-CM | POA: Diagnosis not present

## 2014-10-16 DIAGNOSIS — Z0000 Encounter for general adult medical examination without abnormal findings: Secondary | ICD-10-CM

## 2014-10-16 DIAGNOSIS — I6521 Occlusion and stenosis of right carotid artery: Secondary | ICD-10-CM | POA: Insufficient documentation

## 2014-10-16 DIAGNOSIS — R7302 Impaired glucose tolerance (oral): Secondary | ICD-10-CM

## 2014-10-16 DIAGNOSIS — I10 Essential (primary) hypertension: Secondary | ICD-10-CM | POA: Diagnosis not present

## 2014-10-16 DIAGNOSIS — Z23 Encounter for immunization: Secondary | ICD-10-CM

## 2014-10-16 HISTORY — DX: Occlusion and stenosis of right carotid artery: I65.21

## 2014-10-16 LAB — CBC WITH DIFFERENTIAL/PLATELET
BASOS PCT: 0.2 % (ref 0.0–3.0)
Basophils Absolute: 0 10*3/uL (ref 0.0–0.1)
EOS PCT: 1.9 % (ref 0.0–5.0)
Eosinophils Absolute: 0.2 10*3/uL (ref 0.0–0.7)
HEMATOCRIT: 43.9 % (ref 39.0–52.0)
Hemoglobin: 14.7 g/dL (ref 13.0–17.0)
Lymphocytes Relative: 15.2 % (ref 12.0–46.0)
Lymphs Abs: 1.2 10*3/uL (ref 0.7–4.0)
MCHC: 33.5 g/dL (ref 30.0–36.0)
MCV: 93 fl (ref 78.0–100.0)
MONO ABS: 0.9 10*3/uL (ref 0.1–1.0)
Monocytes Relative: 10.5 % (ref 3.0–12.0)
NEUTROS PCT: 72.2 % (ref 43.0–77.0)
Neutro Abs: 5.9 10*3/uL (ref 1.4–7.7)
Platelets: 241 10*3/uL (ref 150.0–400.0)
RBC: 4.73 Mil/uL (ref 4.22–5.81)
RDW: 13.9 % (ref 11.5–15.5)
WBC: 8.2 10*3/uL (ref 4.0–10.5)

## 2014-10-16 LAB — BASIC METABOLIC PANEL
BUN: 14 mg/dL (ref 6–23)
CHLORIDE: 107 meq/L (ref 96–112)
CO2: 18 mEq/L — ABNORMAL LOW (ref 19–32)
CREATININE: 1 mg/dL (ref 0.4–1.5)
Calcium: 9 mg/dL (ref 8.4–10.5)
GFR: 74.45 mL/min (ref >60.00)
Glucose, Bld: 140 mg/dL — ABNORMAL HIGH (ref 70–99)
Potassium: 4 mEq/L (ref 3.5–5.1)
Sodium: 140 mEq/L (ref 135–145)

## 2014-10-16 LAB — URINALYSIS, ROUTINE W REFLEX MICROSCOPIC
Bilirubin Urine: NEGATIVE
Hgb urine dipstick: NEGATIVE
Ketones, ur: NEGATIVE
Leukocytes, UA: NEGATIVE
NITRITE: NEGATIVE
PH: 6 (ref 5.0–8.0)
RBC / HPF: NONE SEEN (ref 0–?)
SPECIFIC GRAVITY, URINE: 1.01 (ref 1.000–1.030)
TOTAL PROTEIN, URINE-UPE24: NEGATIVE
Urine Glucose: NEGATIVE
Urobilinogen, UA: 0.2 (ref 0.0–1.0)

## 2014-10-16 LAB — LIPID PANEL
CHOL/HDL RATIO: 3
CHOLESTEROL: 116 mg/dL (ref 0–200)
HDL: 40.6 mg/dL (ref >39.00)
LDL CALC: 65 mg/dL (ref 0–99)
NonHDL: 75.4
TRIGLYCERIDES: 52 mg/dL (ref 0.0–149.0)
VLDL: 10.4 mg/dL (ref 0.0–40.0)

## 2014-10-16 LAB — HEPATIC FUNCTION PANEL
ALT: 18 U/L (ref 0–53)
AST: 23 U/L (ref 0–37)
Albumin: 3.4 g/dL — ABNORMAL LOW (ref 3.5–5.2)
Alkaline Phosphatase: 95 U/L (ref 39–117)
BILIRUBIN TOTAL: 1 mg/dL (ref 0.2–1.2)
Bilirubin, Direct: 0.2 mg/dL (ref 0.0–0.3)
Total Protein: 7.3 g/dL (ref 6.0–8.3)

## 2014-10-16 LAB — HEMOGLOBIN A1C: HEMOGLOBIN A1C: 6 % (ref 4.6–6.5)

## 2014-10-16 LAB — TSH: TSH: 1.62 u[IU]/mL (ref 0.35–4.50)

## 2014-10-16 NOTE — Assessment & Plan Note (Signed)
For f/u carotid duplex

## 2014-10-16 NOTE — Patient Instructions (Addendum)
You had the new prevnar pneumonia shot today  You will be contacted regarding the referral for: carotid dopplers  Please continue all other medications as before, and refills have been done if requested.  Please have the pharmacy call with any other refills you may need.  Please continue your efforts at being more active, low cholesterol diet, and weight control.  You are otherwise up to date with prevention measures today.  Please keep your appointments with your specialists as you may have planned  Please go to the LAB in the Basement (turn left off the elevator) for the tests to be done today  You will be contacted by phone if any changes need to be made immediately.  Otherwise, you will receive a letter about your results with an explanation, but please check with MyChart first.  Please remember to sign up for MyChart if you have not done so, as this will be important to you in the future with finding out test results, communicating by private email, and scheduling acute appointments online when needed.  Please return in 1 year for your yearly visit, or sooner if needed, with Lab testing done 3-5 days before

## 2014-10-16 NOTE — Progress Notes (Signed)
Pre visit review using our clinic review tool, if applicable. No additional management support is needed unless otherwise documented below in the visit note. 
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
## 2014-10-16 NOTE — Assessment & Plan Note (Signed)

## 2014-10-16 NOTE — Assessment & Plan Note (Signed)
stable overall by history and exam, recent data reviewed with pt, and pt to continue medical treatment as before,  to f/u any worsening symptoms or concerns Lab Results  Component Value Date   HGBA1C 6.2 05/08/2013   For f/u lab

## 2014-10-16 NOTE — Addendum Note (Signed)
Addended by: Sharon Seller B on: 10/16/2014 01:40 PM   Modules accepted: Orders

## 2014-10-30 ENCOUNTER — Other Ambulatory Visit: Payer: Self-pay | Admitting: Internal Medicine

## 2014-10-30 DIAGNOSIS — R42 Dizziness and giddiness: Secondary | ICD-10-CM

## 2014-11-21 ENCOUNTER — Other Ambulatory Visit: Payer: Self-pay | Admitting: Internal Medicine

## 2015-03-11 DIAGNOSIS — C61 Malignant neoplasm of prostate: Secondary | ICD-10-CM | POA: Diagnosis not present

## 2015-03-18 DIAGNOSIS — C61 Malignant neoplasm of prostate: Secondary | ICD-10-CM | POA: Diagnosis not present

## 2015-03-18 DIAGNOSIS — R3915 Urgency of urination: Secondary | ICD-10-CM | POA: Diagnosis not present

## 2015-05-14 ENCOUNTER — Ambulatory Visit: Payer: Medicare Other | Admitting: Podiatry

## 2015-05-26 ENCOUNTER — Ambulatory Visit (INDEPENDENT_AMBULATORY_CARE_PROVIDER_SITE_OTHER): Payer: Medicare Other

## 2015-05-26 ENCOUNTER — Ambulatory Visit (INDEPENDENT_AMBULATORY_CARE_PROVIDER_SITE_OTHER): Payer: Medicare Other | Admitting: Podiatry

## 2015-05-26 ENCOUNTER — Encounter: Payer: Self-pay | Admitting: Podiatry

## 2015-05-26 VITALS — BP 149/78 | HR 69 | Resp 12

## 2015-05-26 DIAGNOSIS — M722 Plantar fascial fibromatosis: Secondary | ICD-10-CM | POA: Diagnosis not present

## 2015-05-26 MED ORDER — TRIAMCINOLONE ACETONIDE 10 MG/ML IJ SUSP
10.0000 mg | Freq: Once | INTRAMUSCULAR | Status: AC
  Administered 2015-05-26: 10 mg

## 2015-05-26 NOTE — Progress Notes (Signed)
   Subjective:    Patient ID: Daniel Rowland, male    DOB: [birth year], 80 y.o.   MRN: [id number]  HPI  PT STATED LT BOTTOM OF THE HEEL IS BEEN PAINFUL FOR 2 MONTHS. THE FOOT IS THE SAME BUT WORSE FIRST STEP IN THE MORNING. TRIED WARM WATER IT HELPS SOME.  Review of Systems  All other systems reviewed and are negative.      Objective:   Physical Exam        Assessment & Plan:

## 2015-05-26 NOTE — Patient Instructions (Signed)

## 2015-05-28 DIAGNOSIS — M722 Plantar fascial fibromatosis: Secondary | ICD-10-CM | POA: Diagnosis not present

## 2015-05-28 NOTE — Progress Notes (Signed)
Subjective:     Patient ID: Daniel Rowland, male   DOB: Mar 25, 1935, 80 y.o.   MRN: [id number]  HPI patient presents stating I been having a lot of pain in the bottom of my left heel for the last 2-3 months and it's making it difficult for me to walk   Review of Systems  All other systems reviewed and are negative.      Objective:   Physical Exam  Constitutional: He is oriented to person, place, and time.  Cardiovascular: Intact distal pulses.   Musculoskeletal: Normal range of motion.  Neurological: He is oriented to person, place, and time.  Skin: Skin is warm and dry.  Nursing note and vitals reviewed.  neurovascular status was found to be intact with muscle strength adequate and range of motion mildly restricted subtalar midtarsal joint bilateral. Patient's found to have moderate depression of the arch and has inflammation in the plantar heel left at the insertional point of the tendon into the calcaneus with fluid buildup around the insertional point     Assessment:     Plantar fasciitis left with inflammation at the insertion of the tendon into the calcaneus    Plan:     H&P and x-ray reviewed of condition. At this point I did go ahead and I injected the left plantar fascia 3 mg Kenalog 5 mg Xylocaine and applied fascial brace. Gave instructions on physical therapy and reappoint in 1 week and also discussed possible long-term orthotics

## 2015-06-09 ENCOUNTER — Ambulatory Visit (INDEPENDENT_AMBULATORY_CARE_PROVIDER_SITE_OTHER): Payer: Medicare Other | Admitting: Podiatry

## 2015-06-09 ENCOUNTER — Encounter: Payer: Self-pay | Admitting: Podiatry

## 2015-06-09 VITALS — BP 174/95 | HR 80 | Resp 16

## 2015-06-09 DIAGNOSIS — M722 Plantar fascial fibromatosis: Secondary | ICD-10-CM | POA: Diagnosis not present

## 2015-06-09 DIAGNOSIS — M779 Enthesopathy, unspecified: Secondary | ICD-10-CM

## 2015-06-09 MED ORDER — TRIAMCINOLONE ACETONIDE 10 MG/ML IJ SUSP
10.0000 mg | Freq: Once | INTRAMUSCULAR | Status: AC
  Administered 2015-06-09: 10 mg

## 2015-06-10 NOTE — Progress Notes (Signed)
Subjective:     Patient ID: Daniel Rowland, male   DOB: 06-Feb-1935, 79 y.o.   MRN: [id number]  HPI patient states I been having pain in my heel in the outside for the last few weeks and I do think I walk differently   Review of Systems     Objective:   Physical Exam Neurovascular status intact no health history noted that's changed and the lateral side of the ankle is very tender around the peroneal tendon as it comes under the lateral malleolus    Assessment:     Tendinitis with inflammation left lateral foot peroneal    Plan:     Injected the left peroneal complex 3 mg Kenalog 5 mg Xylocaine and instructed on physical therapy

## 2015-06-17 ENCOUNTER — Other Ambulatory Visit: Payer: Self-pay

## 2015-06-17 MED ORDER — ESOMEPRAZOLE MAGNESIUM 40 MG PO CPDR: 40.0000 mg | DELAYED_RELEASE_CAPSULE | Freq: Every day | ORAL | Status: DC

## 2015-08-01 DIAGNOSIS — L821 Other seborrheic keratosis: Secondary | ICD-10-CM | POA: Diagnosis not present

## 2015-08-01 DIAGNOSIS — L820 Inflamed seborrheic keratosis: Secondary | ICD-10-CM | POA: Diagnosis not present

## 2015-08-01 DIAGNOSIS — L814 Other melanin hyperpigmentation: Secondary | ICD-10-CM | POA: Diagnosis not present

## 2015-08-01 DIAGNOSIS — D1801 Hemangioma of skin and subcutaneous tissue: Secondary | ICD-10-CM | POA: Diagnosis not present

## 2015-08-02 ENCOUNTER — Other Ambulatory Visit: Payer: Self-pay | Admitting: Internal Medicine

## 2015-09-16 ENCOUNTER — Ambulatory Visit: Payer: Medicare Other

## 2015-09-16 DIAGNOSIS — Z23 Encounter for immunization: Secondary | ICD-10-CM

## 2015-09-16 DIAGNOSIS — C61 Malignant neoplasm of prostate: Secondary | ICD-10-CM | POA: Diagnosis not present

## 2015-09-23 DIAGNOSIS — C61 Malignant neoplasm of prostate: Secondary | ICD-10-CM | POA: Diagnosis not present

## 2015-09-23 DIAGNOSIS — R3915 Urgency of urination: Secondary | ICD-10-CM | POA: Diagnosis not present

## 2015-09-23 DIAGNOSIS — N390 Urinary tract infection, site not specified: Secondary | ICD-10-CM | POA: Diagnosis not present

## 2015-10-14 DIAGNOSIS — N3942 Incontinence without sensory awareness: Secondary | ICD-10-CM | POA: Diagnosis not present

## 2015-10-14 DIAGNOSIS — N401 Enlarged prostate with lower urinary tract symptoms: Secondary | ICD-10-CM | POA: Diagnosis not present

## 2015-10-14 DIAGNOSIS — N138 Other obstructive and reflux uropathy: Secondary | ICD-10-CM | POA: Diagnosis not present

## 2015-10-14 DIAGNOSIS — R3915 Urgency of urination: Secondary | ICD-10-CM | POA: Diagnosis not present

## 2015-10-22 ENCOUNTER — Encounter: Payer: Self-pay | Admitting: Internal Medicine

## 2015-10-22 ENCOUNTER — Ambulatory Visit (INDEPENDENT_AMBULATORY_CARE_PROVIDER_SITE_OTHER): Payer: Medicare Other | Admitting: Internal Medicine

## 2015-10-22 VITALS — BP 120/76 | HR 97 | Temp 98.5°F | Ht 70.0 in | Wt 187.0 lb

## 2015-10-22 DIAGNOSIS — R7302 Impaired glucose tolerance (oral): Secondary | ICD-10-CM

## 2015-10-22 DIAGNOSIS — R21 Rash and other nonspecific skin eruption: Secondary | ICD-10-CM

## 2015-10-22 DIAGNOSIS — I10 Essential (primary) hypertension: Secondary | ICD-10-CM | POA: Diagnosis not present

## 2015-10-22 MED ORDER — PREDNISONE 10 MG PO TABS: ORAL_TABLET | ORAL | Status: DC

## 2015-10-22 MED ORDER — METHYLPREDNISOLONE ACETATE 80 MG/ML IJ SUSP
80.0000 mg | Freq: Once | INTRAMUSCULAR | Status: AC
  Administered 2015-10-22: 80 mg via INTRAMUSCULAR

## 2015-10-22 MED ORDER — HYDROXYZINE HCL 25 MG PO TABS: ORAL_TABLET | ORAL | Status: DC

## 2015-10-22 NOTE — Progress Notes (Signed)
Pre visit review using our clinic review tool, if applicable. No additional management support is needed unless otherwise documented below in the visit note. 

## 2015-10-22 NOTE — Patient Instructions (Signed)
You had the steroid shot today  Please take all new medication as prescribed - the prednisone, and the medication for itching  You can also continue the benadryl 50 mg as needed  Please continue all other medications as before, and refills have been done if requested.  Please have the pharmacy call with any other refills you may need.  Please continue your efforts at being more active, low cholesterol diet, and weight control.  Please keep your appointments with your specialists as you may have planned
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
## 2015-10-24 DIAGNOSIS — R21 Rash and other nonspecific skin eruption: Secondary | ICD-10-CM | POA: Insufficient documentation

## 2015-10-24 NOTE — Assessment & Plan Note (Signed)
stable overall by history and exam, recent data reviewed with pt, and pt to continue medical treatment as before,  to f/u any worsening symptoms or concerns BP Readings from Last 3 Encounters:  10/22/15 120/76  06/09/15 174/95  05/26/15 149/78

## 2015-10-24 NOTE — Assessment & Plan Note (Signed)
stable overall by history and exam, recent data reviewed with pt, and pt to continue medical treatment as before,  to f/u any worsening symptoms or concerns Lab Results  Component Value Date   HGBA1C 6.0 10/16/2014

## 2015-10-24 NOTE — Assessment & Plan Note (Signed)
Mild to mod, for addition to macrobid to allergies as could be delayed rxn related, for depomedrol IM, predpac asd, atarax prn itching,  to f/u any worsening symptoms or concerns

## 2015-11-17 ENCOUNTER — Other Ambulatory Visit: Payer: Medicare Other

## 2015-11-17 DIAGNOSIS — R3915 Urgency of urination: Secondary | ICD-10-CM | POA: Diagnosis not present

## 2015-11-17 DIAGNOSIS — N401 Enlarged prostate with lower urinary tract symptoms: Secondary | ICD-10-CM | POA: Diagnosis not present

## 2015-11-18 ENCOUNTER — Telehealth: Payer: Self-pay

## 2015-11-18 DIAGNOSIS — Z0000 Encounter for general adult medical examination without abnormal findings: Secondary | ICD-10-CM

## 2015-11-18 DIAGNOSIS — R7309 Other abnormal glucose: Secondary | ICD-10-CM

## 2015-11-18 NOTE — Telephone Encounter (Signed)
Pt requests labs prior to appt 12.02.2016

## 2015-11-21 ENCOUNTER — Ambulatory Visit (INDEPENDENT_AMBULATORY_CARE_PROVIDER_SITE_OTHER): Payer: Medicare Other | Admitting: Internal Medicine

## 2015-11-21 ENCOUNTER — Other Ambulatory Visit: Payer: Self-pay | Admitting: Internal Medicine

## 2015-11-21 ENCOUNTER — Encounter: Payer: Self-pay | Admitting: Internal Medicine

## 2015-11-21 ENCOUNTER — Other Ambulatory Visit (INDEPENDENT_AMBULATORY_CARE_PROVIDER_SITE_OTHER): Payer: Medicare Other

## 2015-11-21 VITALS — BP 130/68 | HR 68 | Temp 98.1°F | Ht 70.0 in | Wt 186.0 lb

## 2015-11-21 DIAGNOSIS — R7302 Impaired glucose tolerance (oral): Secondary | ICD-10-CM

## 2015-11-21 DIAGNOSIS — N400 Enlarged prostate without lower urinary tract symptoms: Secondary | ICD-10-CM | POA: Diagnosis not present

## 2015-11-21 DIAGNOSIS — E785 Hyperlipidemia, unspecified: Secondary | ICD-10-CM

## 2015-11-21 DIAGNOSIS — Z0000 Encounter for general adult medical examination without abnormal findings: Secondary | ICD-10-CM

## 2015-11-21 DIAGNOSIS — R7309 Other abnormal glucose: Secondary | ICD-10-CM

## 2015-11-21 DIAGNOSIS — M6530 Trigger finger, unspecified finger: Secondary | ICD-10-CM | POA: Insufficient documentation

## 2015-11-21 LAB — URINALYSIS, ROUTINE W REFLEX MICROSCOPIC
BILIRUBIN URINE: NEGATIVE
Ketones, ur: NEGATIVE
NITRITE: NEGATIVE
PH: 6 (ref 5.0–8.0)
Specific Gravity, Urine: 1.02 (ref 1.000–1.030)
TOTAL PROTEIN, URINE-UPE24: NEGATIVE
URINE GLUCOSE: NEGATIVE
UROBILINOGEN UA: 0.2 (ref 0.0–1.0)

## 2015-11-21 LAB — LIPID PANEL
CHOL/HDL RATIO: 3
Cholesterol: 125 mg/dL (ref 0–200)
HDL: 49.6 mg/dL (ref >39.00)
LDL Cholesterol: 68 mg/dL (ref 0–99)
NONHDL: 75.21
Triglycerides: 36 mg/dL (ref 0.0–149.0)
VLDL: 7.2 mg/dL (ref 0.0–40.0)

## 2015-11-21 LAB — CBC WITH DIFFERENTIAL/PLATELET
BASOS ABS: 0 10*3/uL (ref 0.0–0.1)
Basophils Relative: 0.3 % (ref 0.0–3.0)
Eosinophils Absolute: 0.2 10*3/uL (ref 0.0–0.7)
Eosinophils Relative: 2.9 % (ref 0.0–5.0)
HCT: 45.2 % (ref 39.0–52.0)
HEMOGLOBIN: 15 g/dL (ref 13.0–17.0)
LYMPHS ABS: 1.1 10*3/uL (ref 0.7–4.0)
Lymphocytes Relative: 16.5 % (ref 12.0–46.0)
MCHC: 33.3 g/dL (ref 30.0–36.0)
MCV: 93.9 fl (ref 78.0–100.0)
MONO ABS: 0.7 10*3/uL (ref 0.1–1.0)
Monocytes Relative: 10.5 % (ref 3.0–12.0)
NEUTROS PCT: 69.8 % (ref 43.0–77.0)
Neutro Abs: 4.8 10*3/uL (ref 1.4–7.7)
Platelets: 238 10*3/uL (ref 150.0–400.0)
RBC: 4.81 Mil/uL (ref 4.22–5.81)
RDW: 13.9 % (ref 11.5–15.5)
WBC: 6.9 10*3/uL (ref 4.0–10.5)

## 2015-11-21 LAB — HEPATIC FUNCTION PANEL
ALK PHOS: 104 U/L (ref 39–117)
ALT: 21 U/L (ref 0–53)
AST: 19 U/L (ref 0–37)
Albumin: 3.7 g/dL (ref 3.5–5.2)
BILIRUBIN DIRECT: 0.1 mg/dL (ref 0.0–0.3)
BILIRUBIN TOTAL: 0.5 mg/dL (ref 0.2–1.2)
Total Protein: 6.5 g/dL (ref 6.0–8.3)

## 2015-11-21 LAB — HEMOGLOBIN A1C: HEMOGLOBIN A1C: 6 % (ref 4.6–6.5)

## 2015-11-21 LAB — BASIC METABOLIC PANEL
BUN: 18 mg/dL (ref 6–23)
CO2: 28 meq/L (ref 19–32)
CREATININE: 1.02 mg/dL (ref 0.40–1.50)
Calcium: 9 mg/dL (ref 8.4–10.5)
Chloride: 107 mEq/L (ref 96–112)
GFR: 75.08 mL/min (ref >60.00)
GLUCOSE: 88 mg/dL (ref 70–99)
Potassium: 4.8 mEq/L (ref 3.5–5.1)
Sodium: 142 mEq/L (ref 135–145)

## 2015-11-21 LAB — PSA: PSA: 0.14 ng/mL (ref 0.10–4.00)

## 2015-11-21 LAB — TSH: TSH: 1.4 u[IU]/mL (ref 0.35–4.50)

## 2015-11-21 MED ORDER — CIPROFLOXACIN HCL 500 MG PO TABS: 500.0000 mg | ORAL_TABLET | Freq: Two times a day (BID) | ORAL | Status: DC

## 2015-11-21 NOTE — Assessment & Plan Note (Signed)
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
## 2015-11-21 NOTE — Progress Notes (Signed)
Pre visit review using our clinic review tool, if applicable. No additional management support is needed unless otherwise documented below in the visit note. 

## 2015-11-21 NOTE — Addendum Note (Signed)
Addended by: Lyman Bishop on: 11/21/2015 01:37 PM   Modules accepted: Orders

## 2015-11-21 NOTE — Patient Instructions (Addendum)
Your EKG was OK today  Please call for hand surgury referral if the trigger fingers get worse  Please continue all other medications as before, and refills have been done if requested.  Please have the pharmacy call with any other refills you may need.  Please continue your efforts at being more active, low cholesterol diet, and weight control.  You are otherwise up to date with prevention measures today.  Please keep your appointments with your specialists as you may have planned - urology  Your lab work was done earlier today  You will be contacted by phone if any changes need to be made immediately.  Otherwise, you will receive a letter about your results with an explanation, but please check with MyChart first.  Please remember to sign up for MyChart if you have not done so, as this will be important to you in the future with finding out test results, communicating by private email, and scheduling acute appointments online when needed.  Please return in 1 year for your yearly visit, or sooner if needed, with Lab testing done 3-5 days before

## 2015-11-21 NOTE — Addendum Note (Signed)
Addended by: Biagio Borg on: 11/21/2015 01:50 PM   Modules accepted: Orders, SmartSet

## 2015-11-21 NOTE — Assessment & Plan Note (Signed)
4th and 5th fingers right hand, mild , no pain, declines surgical referral for now

## 2015-11-21 NOTE — Assessment & Plan Note (Signed)
stable overall by history and exam, recent data reviewed with pt, and pt to continue medical treatment as before,  to f/u any worsening symptoms or concerns Lab Results  Component Value Date   HGBA1C 6.0 10/16/2014    

## 2015-12-09 ENCOUNTER — Telehealth: Payer: Self-pay | Admitting: Internal Medicine

## 2015-12-09 NOTE — Telephone Encounter (Signed)
Pt's spouse advise that repeat UA is not needed

## 2015-12-09 NOTE — Telephone Encounter (Signed)
Pt wants to speak to the assistant concern about urinalysis result. Pt was inform that he suppose to come back and get it recheck but unsure when. Can we call her back explain this.

## 2015-12-10 ENCOUNTER — Other Ambulatory Visit: Payer: Self-pay | Admitting: Internal Medicine

## 2016-02-05 ENCOUNTER — Other Ambulatory Visit: Payer: Self-pay | Admitting: Internal Medicine

## 2016-03-09 DIAGNOSIS — Z0000 Encounter for general adult medical examination without abnormal findings: Secondary | ICD-10-CM | POA: Diagnosis not present

## 2016-03-09 DIAGNOSIS — N3000 Acute cystitis without hematuria: Secondary | ICD-10-CM | POA: Diagnosis not present

## 2016-03-19 DIAGNOSIS — Z0000 Encounter for general adult medical examination without abnormal findings: Secondary | ICD-10-CM | POA: Diagnosis not present

## 2016-03-19 DIAGNOSIS — R3129 Other microscopic hematuria: Secondary | ICD-10-CM | POA: Diagnosis not present

## 2016-03-19 DIAGNOSIS — R310 Gross hematuria: Secondary | ICD-10-CM | POA: Diagnosis not present

## 2016-03-19 DIAGNOSIS — N3091 Cystitis, unspecified with hematuria: Secondary | ICD-10-CM | POA: Diagnosis not present

## 2016-03-19 DIAGNOSIS — M545 Low back pain: Secondary | ICD-10-CM | POA: Diagnosis not present

## 2016-05-11 DIAGNOSIS — N401 Enlarged prostate with lower urinary tract symptoms: Secondary | ICD-10-CM | POA: Diagnosis not present

## 2016-05-11 DIAGNOSIS — R8271 Bacteriuria: Secondary | ICD-10-CM | POA: Diagnosis not present

## 2016-05-11 DIAGNOSIS — N3091 Cystitis, unspecified with hematuria: Secondary | ICD-10-CM | POA: Diagnosis not present

## 2016-05-11 DIAGNOSIS — Z0000 Encounter for general adult medical examination without abnormal findings: Secondary | ICD-10-CM | POA: Diagnosis not present

## 2016-05-11 DIAGNOSIS — C61 Malignant neoplasm of prostate: Secondary | ICD-10-CM | POA: Diagnosis not present

## 2016-08-24 ENCOUNTER — Telehealth: Payer: Self-pay | Admitting: Internal Medicine

## 2016-08-24 DIAGNOSIS — Z0000 Encounter for general adult medical examination without abnormal findings: Secondary | ICD-10-CM

## 2016-08-24 NOTE — Telephone Encounter (Signed)
Orders placed.

## 2016-08-24 NOTE — Telephone Encounter (Signed)
Patient requesting labs to be entered before CPE in December.

## 2016-09-09 ENCOUNTER — Telehealth: Payer: Self-pay | Admitting: Emergency Medicine

## 2016-09-09 MED ORDER — AZELASTINE HCL 0.05 % OP SOLN: 1.0000 [drp] | Freq: Two times a day (BID) | OPHTHALMIC | 12 refills | Status: DC

## 2016-09-09 NOTE — Telephone Encounter (Signed)
Pts wife called and is wondering if he can get some eye drops. His eye is very itchy and red. If so Pharmacy is [facility] [address]. Please advise thanks.

## 2016-09-09 NOTE — Telephone Encounter (Signed)
[place] for [employer] - done erx

## 2016-09-14 ENCOUNTER — Ambulatory Visit (INDEPENDENT_AMBULATORY_CARE_PROVIDER_SITE_OTHER): Payer: Medicare Other

## 2016-09-14 DIAGNOSIS — Z23 Encounter for immunization: Secondary | ICD-10-CM

## 2016-10-14 DIAGNOSIS — C61 Malignant neoplasm of prostate: Secondary | ICD-10-CM | POA: Diagnosis not present

## 2016-10-21 DIAGNOSIS — R339 Retention of urine, unspecified: Secondary | ICD-10-CM | POA: Diagnosis not present

## 2016-10-27 DIAGNOSIS — R339 Retention of urine, unspecified: Secondary | ICD-10-CM | POA: Diagnosis not present

## 2016-11-15 ENCOUNTER — Other Ambulatory Visit (INDEPENDENT_AMBULATORY_CARE_PROVIDER_SITE_OTHER): Payer: Medicare Other

## 2016-11-15 DIAGNOSIS — Z0000 Encounter for general adult medical examination without abnormal findings: Secondary | ICD-10-CM

## 2016-11-15 LAB — HEMOGLOBIN A1C: Hgb A1c MFr Bld: 5.8 % (ref 4.6–6.5)

## 2016-11-15 LAB — BASIC METABOLIC PANEL
BUN: 15 mg/dL (ref 6–23)
CHLORIDE: 107 meq/L (ref 96–112)
CO2: 30 meq/L (ref 19–32)
Calcium: 9.2 mg/dL (ref 8.4–10.5)
Creatinine, Ser: 1.13 mg/dL (ref 0.40–1.50)
GFR: 66.54 mL/min (ref >60.00)
GLUCOSE: 129 mg/dL — AB (ref 70–99)
POTASSIUM: 5.2 meq/L — AB (ref 3.5–5.1)
Sodium: 141 mEq/L (ref 135–145)

## 2016-11-15 LAB — LIPID PANEL
CHOL/HDL RATIO: 3
Cholesterol: 153 mg/dL (ref 0–200)
HDL: 44.8 mg/dL (ref >39.00)
LDL Cholesterol: 88 mg/dL (ref 0–99)
NONHDL: 108.41
Triglycerides: 102 mg/dL (ref 0.0–149.0)
VLDL: 20.4 mg/dL (ref 0.0–40.0)

## 2016-11-15 LAB — HEPATIC FUNCTION PANEL
ALT: 13 U/L (ref 0–53)
AST: 15 U/L (ref 0–37)
Albumin: 4 g/dL (ref 3.5–5.2)
Alkaline Phosphatase: 87 U/L (ref 39–117)
BILIRUBIN TOTAL: 0.7 mg/dL (ref 0.2–1.2)
Bilirubin, Direct: 0.1 mg/dL (ref 0.0–0.3)
TOTAL PROTEIN: 6.6 g/dL (ref 6.0–8.3)

## 2016-11-15 LAB — CBC
HCT: 45.7 % (ref 39.0–52.0)
HEMOGLOBIN: 15.4 g/dL (ref 13.0–17.0)
MCHC: 33.8 g/dL (ref 30.0–36.0)
MCV: 92.7 fl (ref 78.0–100.0)
PLATELETS: 238 10*3/uL (ref 150.0–400.0)
RBC: 4.93 Mil/uL (ref 4.22–5.81)
RDW: 13.8 % (ref 11.5–15.5)
WBC: 6.4 10*3/uL (ref 4.0–10.5)

## 2016-11-15 LAB — PSA: PSA: 0.17 ng/mL (ref 0.10–4.00)

## 2016-11-15 LAB — TSH: TSH: 1.19 u[IU]/mL (ref 0.35–4.50)

## 2016-11-23 ENCOUNTER — Ambulatory Visit (INDEPENDENT_AMBULATORY_CARE_PROVIDER_SITE_OTHER): Payer: Medicare Other | Admitting: Internal Medicine

## 2016-11-23 ENCOUNTER — Encounter: Payer: Self-pay | Admitting: Internal Medicine

## 2016-11-23 VITALS — BP 136/78 | HR 70 | Temp 98.6°F | Resp 20 | Wt 200.0 lb

## 2016-11-23 DIAGNOSIS — I6529 Occlusion and stenosis of unspecified carotid artery: Secondary | ICD-10-CM | POA: Insufficient documentation

## 2016-11-23 DIAGNOSIS — R059 Cough, unspecified: Secondary | ICD-10-CM

## 2016-11-23 DIAGNOSIS — Z0001 Encounter for general adult medical examination with abnormal findings: Secondary | ICD-10-CM

## 2016-11-23 DIAGNOSIS — I6523 Occlusion and stenosis of bilateral carotid arteries: Secondary | ICD-10-CM | POA: Diagnosis not present

## 2016-11-23 DIAGNOSIS — I10 Essential (primary) hypertension: Secondary | ICD-10-CM

## 2016-11-23 DIAGNOSIS — Z0000 Encounter for general adult medical examination without abnormal findings: Secondary | ICD-10-CM

## 2016-11-23 DIAGNOSIS — R7302 Impaired glucose tolerance (oral): Secondary | ICD-10-CM

## 2016-11-23 DIAGNOSIS — R05 Cough: Secondary | ICD-10-CM | POA: Diagnosis not present

## 2016-11-23 DIAGNOSIS — R062 Wheezing: Secondary | ICD-10-CM

## 2016-11-23 MED ORDER — AZITHROMYCIN 250 MG PO TABS: ORAL_TABLET | ORAL | 1 refills | Status: DC

## 2016-11-23 NOTE — Progress Notes (Signed)
Pre visit review using our clinic review tool, if applicable. No additional management support is needed unless otherwise documented below in the visit note. 

## 2016-11-23 NOTE — Patient Instructions (Signed)
Please take all new medication as prescribed - the antibiotic  Please continue all other medications as before, and refills have been done if requested.  Please have the pharmacy call with any other refills you may need.  Please continue your efforts at being more active, low cholesterol diet, and weight control.  You are otherwise up to date with prevention measures today.  Please keep your appointments with your specialists as you may have planned  You will be contacted regarding the referral for: Carotid ultrasound  Your lab work was [facility] today  Please remember to sign up for MyChart if you have not done so, as this will be important to you in the future with finding out test results, communicating by private email, and scheduling acute appointments online when needed.  If you have Medicare related insurance (such as traditoinal Medicare, [employer] or [employer], or similar), Please make an appointment at the Scheduling desk with Maudie Mercury, the [employer], for your Wellness Visit in this office, which is a benefit with your insurance.  Please return in 1 year for your yearly visit, or sooner if needed, with Lab testing done 3-5 days before
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
## 2016-11-30 DIAGNOSIS — R339 Retention of urine, unspecified: Secondary | ICD-10-CM | POA: Diagnosis not present

## 2016-11-30 NOTE — Assessment & Plan Note (Signed)
Mild to mod, acute sinus infxn, for antibx course,  to f/u any worsening symptoms or concerns

## 2016-11-30 NOTE — Assessment & Plan Note (Signed)

## 2016-11-30 NOTE — Assessment & Plan Note (Signed)
stable overall by history and exam, recent data reviewed with pt, and pt to continue medical treatment as before,  to f/u any worsening symptoms or concerns Lab Results  Component Value Date   HGBA1C 5.8 11/15/2016

## 2016-11-30 NOTE — Assessment & Plan Note (Signed)
To restart asa, statin, for carotid dopplers,  to f/u any worsening symptoms or concerns

## 2016-11-30 NOTE — Assessment & Plan Note (Signed)
Mild, without worsening dyspnea, pt to call for any worsening

## 2016-11-30 NOTE — Assessment & Plan Note (Signed)
stable overall by history and exam, recent data reviewed with pt, and pt to continue medical treatment as before,  to f/u any worsening symptoms or concerns BP Readings from Last 3 Encounters:  11/23/16 136/78  11/21/15 130/68  10/22/15 120/76

## 2016-12-16 ENCOUNTER — Ambulatory Visit (INDEPENDENT_AMBULATORY_CARE_PROVIDER_SITE_OTHER): Payer: Medicare Other | Admitting: Internal Medicine

## 2016-12-16 ENCOUNTER — Ambulatory Visit (INDEPENDENT_AMBULATORY_CARE_PROVIDER_SITE_OTHER)
Admission: RE | Admit: 2016-12-16 | Discharge: 2016-12-16 | Disposition: A | Discharge status: Home / Self Care | Payer: Medicare Other | Source: Ambulatory Visit | Attending: Internal Medicine | Admitting: Internal Medicine

## 2016-12-16 ENCOUNTER — Encounter: Payer: Self-pay | Admitting: Internal Medicine

## 2016-12-16 VITALS — BP 130/58 | HR 82 | Temp 98.1°F | Resp 14 | Ht 70.0 in | Wt 203.0 lb

## 2016-12-16 DIAGNOSIS — I10 Essential (primary) hypertension: Secondary | ICD-10-CM

## 2016-12-16 DIAGNOSIS — R059 Cough, unspecified: Secondary | ICD-10-CM

## 2016-12-16 DIAGNOSIS — R7302 Impaired glucose tolerance (oral): Secondary | ICD-10-CM | POA: Diagnosis not present

## 2016-12-16 DIAGNOSIS — R05 Cough: Secondary | ICD-10-CM

## 2016-12-16 MED ORDER — TRIAMCINOLONE ACETONIDE 55 MCG/ACT NA AERO: 2.0000 | INHALATION_SPRAY | Freq: Every day | NASAL | 12 refills | Status: DC

## 2016-12-16 MED ORDER — CETIRIZINE HCL 10 MG PO TABS: 10.0000 mg | ORAL_TABLET | Freq: Every day | ORAL | 11 refills | Status: DC

## 2016-12-16 MED ORDER — LEVOFLOXACIN 250 MG PO TABS: 250.0000 mg | ORAL_TABLET | Freq: Every day | ORAL | 0 refills | Status: DC
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
## 2016-12-16 NOTE — Patient Instructions (Signed)
Please take all new medication as prescribed - the antibiotic, as well as the Zyrtec and nasacort OTC  You can also take Delsym OTC for cough, and/or Mucinex (or it's generic off brand) for congestion, and tylenol as needed for pain.  Please continue all other medications as before, and refills have been done if requested.  Please have the pharmacy call with any other refills you may need.  Please keep your appointments with your specialists as you may have planned  Please go to the XRAY Department in the Basement (go straight as you get off the elevator) for the x-ray testing  You will be contacted by phone if any changes need to be made immediately.  Otherwise, you will receive a letter about your results with an explanation, but please check with MyChart first.  Please remember to sign up for MyChart if you have not done so, as this will be important to you in the future with finding out test results, communicating by private email, and scheduling acute appointments online when needed.

## 2016-12-16 NOTE — Assessment & Plan Note (Signed)
stable overall by history and exam, recent data reviewed with pt, and pt to continue medical treatment as before,  to f/u any worsening symptoms or concerns Lab Results  Component Value Date   HGBA1C 5.8 11/15/2016

## 2016-12-16 NOTE — Assessment & Plan Note (Signed)
Etiology unclear, suspect possible new allergy vs infectious, cant r/o pna, for cxr, also antibx asd, delsym and mucinex otc prn,  to f/u any worsening symptoms or concerns

## 2016-12-16 NOTE — Assessment & Plan Note (Signed)
stable overall by history and exam, recent data reviewed with pt, and pt to continue medical treatment as before,  to f/u any worsening symptoms or concerns BP Readings from Last 3 Encounters:  12/16/16 (!) 130/58  11/23/16 136/78  11/21/15 130/68

## 2016-12-16 NOTE — Progress Notes (Signed)
Pre visit review using our clinic review tool, if applicable. No additional management support is needed unless otherwise documented below in the visit note. 

## 2016-12-17 ENCOUNTER — Encounter: Payer: Self-pay | Admitting: Internal Medicine

## 2017-01-05 ENCOUNTER — Inpatient Hospital Stay (HOSPITAL_COMMUNITY): Admission: RE | Admit: 2017-01-05 | Payer: Medicare Other | Source: Ambulatory Visit

## 2017-01-07 DIAGNOSIS — R339 Retention of urine, unspecified: Secondary | ICD-10-CM | POA: Diagnosis not present

## 2017-01-14 ENCOUNTER — Ambulatory Visit (HOSPITAL_COMMUNITY)
Admission: RE | Admit: 2017-01-14 | Discharge: 2017-01-14 | Disposition: A | Discharge status: Home / Self Care | Payer: Medicare Other | Source: Ambulatory Visit | Attending: Cardiology | Admitting: Cardiology

## 2017-01-14 DIAGNOSIS — I10 Essential (primary) hypertension: Secondary | ICD-10-CM | POA: Diagnosis not present

## 2017-01-14 DIAGNOSIS — I6523 Occlusion and stenosis of bilateral carotid arteries: Secondary | ICD-10-CM | POA: Diagnosis not present

## 2017-01-14 DIAGNOSIS — Z87891 Personal history of nicotine dependence: Secondary | ICD-10-CM | POA: Insufficient documentation

## 2017-01-14 DIAGNOSIS — Z8673 Personal history of transient ischemic attack (TIA), and cerebral infarction without residual deficits: Secondary | ICD-10-CM | POA: Insufficient documentation

## 2017-01-14 DIAGNOSIS — E785 Hyperlipidemia, unspecified: Secondary | ICD-10-CM | POA: Diagnosis not present

## 2017-01-18 ENCOUNTER — Encounter: Payer: Self-pay | Admitting: Internal Medicine

## 2017-01-21 ENCOUNTER — Telehealth: Payer: Self-pay | Admitting: Internal Medicine

## 2017-01-21 NOTE — Telephone Encounter (Signed)
Pt called in and

## 2017-01-24 ENCOUNTER — Telehealth: Payer: Self-pay | Admitting: Internal Medicine

## 2017-01-24 NOTE — Telephone Encounter (Signed)
Do not have results yet 

## 2017-01-24 NOTE — Telephone Encounter (Signed)
Spouse called in stating patient has not heard back on doppler results for 1/26.  Please follow up in regard with patient.

## 2017-01-31 NOTE — Telephone Encounter (Signed)
Pt called in again about the results.  She was un aware that Dr Jenny Reichmann was not in.

## 2017-02-07 DIAGNOSIS — R339 Retention of urine, unspecified: Secondary | ICD-10-CM | POA: Diagnosis not present

## 2017-02-09 NOTE — Telephone Encounter (Signed)
Routing to dr john, please advise, thanks 

## 2017-02-09 NOTE — Telephone Encounter (Signed)
Letter was supposed to have been sent jan 30  No significant blockages found  OK to re-mail letter if pt needs this

## 2017-02-09 NOTE — Telephone Encounter (Signed)
Requesting results of doppler.  Please call patient back [phone].

## 2017-02-10 NOTE — Telephone Encounter (Signed)
Called and left message, patient asked to call back if he needs another letter, but test results show no blockages

## 2017-02-11 ENCOUNTER — Ambulatory Visit: Payer: Medicare Other

## 2017-02-12 ENCOUNTER — Other Ambulatory Visit: Payer: Self-pay | Admitting: Internal Medicine

## 2017-02-14 MED ORDER — LOVASTATIN 40 MG PO TABS: ORAL_TABLET | ORAL | 2 refills | Status: DC

## 2017-02-14 NOTE — Addendum Note (Signed)
Addended by: Earnstine Regal on: 02/14/2017 10:01 AM   Modules accepted: Orders

## 2017-03-02 DIAGNOSIS — R339 Retention of urine, unspecified: Secondary | ICD-10-CM | POA: Diagnosis not present

## 2017-03-29 DIAGNOSIS — R339 Retention of urine, unspecified: Secondary | ICD-10-CM | POA: Diagnosis not present

## 2017-04-26 DIAGNOSIS — R339 Retention of urine, unspecified: Secondary | ICD-10-CM | POA: Diagnosis not present

## 2017-05-24 ENCOUNTER — Other Ambulatory Visit: Payer: Self-pay | Admitting: Internal Medicine

## 2017-05-25 DIAGNOSIS — H02402 Unspecified ptosis of left eyelid: Secondary | ICD-10-CM | POA: Diagnosis not present

## 2017-05-25 DIAGNOSIS — H25813 Combined forms of age-related cataract, bilateral: Secondary | ICD-10-CM | POA: Diagnosis not present

## 2017-05-25 DIAGNOSIS — H02831 Dermatochalasis of right upper eyelid: Secondary | ICD-10-CM | POA: Diagnosis not present

## 2017-05-27 DIAGNOSIS — R339 Retention of urine, unspecified: Secondary | ICD-10-CM | POA: Diagnosis not present

## 2017-05-30 ENCOUNTER — Encounter: Payer: Self-pay | Admitting: *Deleted

## 2017-06-27 DIAGNOSIS — R339 Retention of urine, unspecified: Secondary | ICD-10-CM | POA: Diagnosis not present

## 2017-07-22 DIAGNOSIS — R339 Retention of urine, unspecified: Secondary | ICD-10-CM | POA: Diagnosis not present

## 2017-08-25 DIAGNOSIS — R339 Retention of urine, unspecified: Secondary | ICD-10-CM | POA: Diagnosis not present

## 2017-09-08 ENCOUNTER — Ambulatory Visit (INDEPENDENT_AMBULATORY_CARE_PROVIDER_SITE_OTHER): Payer: Medicare Other | Admitting: General Practice

## 2017-09-08 DIAGNOSIS — Z23 Encounter for immunization: Secondary | ICD-10-CM

## 2017-09-23 DIAGNOSIS — N281 Cyst of kidney, acquired: Secondary | ICD-10-CM | POA: Diagnosis not present

## 2017-09-23 DIAGNOSIS — M549 Dorsalgia, unspecified: Secondary | ICD-10-CM | POA: Diagnosis not present

## 2017-09-23 DIAGNOSIS — R3914 Feeling of incomplete bladder emptying: Secondary | ICD-10-CM | POA: Diagnosis not present

## 2017-09-28 DIAGNOSIS — R339 Retention of urine, unspecified: Secondary | ICD-10-CM | POA: Diagnosis not present

## 2017-10-14 ENCOUNTER — Other Ambulatory Visit: Payer: Self-pay

## 2017-10-14 NOTE — Patient Outreach (Signed)
[facility]) Care Management  10/14/2017  Karanveer Ramakrishnan Claire [birth year] [id number]   Medication Adherence call to Mr. Salomon Kern the reason for this call is because Mr. Nicolson is showing past due under [place] [employer]on Lovastatin 40 mg spoke to patient he said he still has medication and he wont need any until the end of the year.   [facility] [place] Management Direct Dial [phone]  Fax [phone] [email or web address]

## 2017-10-21 DIAGNOSIS — C61 Malignant neoplasm of prostate: Secondary | ICD-10-CM | POA: Diagnosis not present

## 2017-10-25 DIAGNOSIS — R339 Retention of urine, unspecified: Secondary | ICD-10-CM | POA: Diagnosis not present

## 2017-10-27 DIAGNOSIS — R3914 Feeling of incomplete bladder emptying: Secondary | ICD-10-CM | POA: Diagnosis not present

## 2017-11-16 DIAGNOSIS — R339 Retention of urine, unspecified: Secondary | ICD-10-CM | POA: Diagnosis not present

## 2017-11-28 ENCOUNTER — Encounter: Payer: Medicare Other | Admitting: Internal Medicine

## 2017-12-05 ENCOUNTER — Other Ambulatory Visit (INDEPENDENT_AMBULATORY_CARE_PROVIDER_SITE_OTHER): Payer: Medicare Other

## 2017-12-05 ENCOUNTER — Encounter: Payer: Self-pay | Admitting: Internal Medicine

## 2017-12-05 ENCOUNTER — Ambulatory Visit (INDEPENDENT_AMBULATORY_CARE_PROVIDER_SITE_OTHER): Payer: Medicare Other | Admitting: Internal Medicine

## 2017-12-05 VITALS — BP 138/76 | HR 68 | Temp 98.0°F | Ht 70.0 in | Wt 206.0 lb

## 2017-12-05 DIAGNOSIS — E785 Hyperlipidemia, unspecified: Secondary | ICD-10-CM

## 2017-12-05 DIAGNOSIS — Z0000 Encounter for general adult medical examination without abnormal findings: Secondary | ICD-10-CM | POA: Diagnosis not present

## 2017-12-05 LAB — CBC WITH DIFFERENTIAL/PLATELET
Basophils Absolute: 0 10*3/uL (ref 0.0–0.1)
Basophils Relative: 0.7 % (ref 0.0–3.0)
EOS ABS: 0.1 10*3/uL (ref 0.0–0.7)
Eosinophils Relative: 2 % (ref 0.0–5.0)
HEMATOCRIT: 44.3 % (ref 39.0–52.0)
HEMOGLOBIN: 14.9 g/dL (ref 13.0–17.0)
LYMPHS PCT: 20.8 % (ref 12.0–46.0)
Lymphs Abs: 1.3 10*3/uL (ref 0.7–4.0)
MCHC: 33.6 g/dL (ref 30.0–36.0)
MCV: 94.7 fl (ref 78.0–100.0)
MONO ABS: 0.8 10*3/uL (ref 0.1–1.0)
Monocytes Relative: 13.5 % — ABNORMAL HIGH (ref 3.0–12.0)
Neutro Abs: 3.9 10*3/uL (ref 1.4–7.7)
Neutrophils Relative %: 63 % (ref 43.0–77.0)
Platelets: 244 10*3/uL (ref 150.0–400.0)
RBC: 4.68 Mil/uL (ref 4.22–5.81)
RDW: 13.5 % (ref 11.5–15.5)
WBC: 6.3 10*3/uL (ref 4.0–10.5)

## 2017-12-05 LAB — URINALYSIS, ROUTINE W REFLEX MICROSCOPIC
BILIRUBIN URINE: NEGATIVE
Hgb urine dipstick: NEGATIVE
KETONES UR: NEGATIVE
LEUKOCYTES UA: NEGATIVE
NITRITE: NEGATIVE
PH: 6 (ref 5.0–8.0)
SPECIFIC GRAVITY, URINE: 1.015 (ref 1.000–1.030)
Total Protein, Urine: NEGATIVE
UROBILINOGEN UA: 0.2 (ref 0.0–1.0)
Urine Glucose: NEGATIVE

## 2017-12-05 LAB — LIPID PANEL
CHOLESTEROL: 110 mg/dL (ref 0–200)
HDL: 46.8 mg/dL (ref >39.00)
LDL CALC: 51 mg/dL (ref 0–99)
NonHDL: 63.46
Total CHOL/HDL Ratio: 2
Triglycerides: 63 mg/dL (ref 0.0–149.0)
VLDL: 12.6 mg/dL (ref 0.0–40.0)

## 2017-12-05 LAB — TSH: TSH: 1.72 u[IU]/mL (ref 0.35–4.50)

## 2017-12-05 LAB — HEPATIC FUNCTION PANEL
ALK PHOS: 77 U/L (ref 39–117)
ALT: 14 U/L (ref 0–53)
AST: 17 U/L (ref 0–37)
Albumin: 3.9 g/dL (ref 3.5–5.2)
BILIRUBIN DIRECT: 0.1 mg/dL (ref 0.0–0.3)
BILIRUBIN TOTAL: 0.6 mg/dL (ref 0.2–1.2)
TOTAL PROTEIN: 6.8 g/dL (ref 6.0–8.3)

## 2017-12-05 LAB — BASIC METABOLIC PANEL
BUN: 13 mg/dL (ref 6–23)
CHLORIDE: 107 meq/L (ref 96–112)
CO2: 28 mEq/L (ref 19–32)
Calcium: 8.7 mg/dL (ref 8.4–10.5)
Creatinine, Ser: 1.04 mg/dL (ref 0.40–1.50)
GFR: 73.03 mL/min (ref >60.00)
GLUCOSE: 87 mg/dL (ref 70–99)
POTASSIUM: 3.9 meq/L (ref 3.5–5.1)
SODIUM: 140 meq/L (ref 135–145)

## 2017-12-05 LAB — HEMOGLOBIN A1C: HEMOGLOBIN A1C: 6.1 % (ref 4.6–6.5)

## 2017-12-05 MED ORDER — ZOSTER VAC RECOMB ADJUVANTED 50 MCG/0.5ML IM SUSR: 0.5000 mL | Freq: Once | INTRAMUSCULAR | 1 refills | Status: AC

## 2017-12-05 MED ORDER — ROSUVASTATIN CALCIUM 40 MG PO TABS: 40.0000 mg | ORAL_TABLET | Freq: Every day | ORAL | 3 refills | Status: DC
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
## 2017-12-05 NOTE — Patient Instructions (Addendum)
Your [place] shot was sent to the pharamacy  OK to change the lovastatin to crestor  Please take all new medication as prescribed - the crestor 40 mg   Please continue all other medications as before, and refills have been done if requested.  Please have the pharmacy call with any other refills you may need.  Please continue your efforts at being more active, low cholesterol diet, and weight control.  You are otherwise up to date with prevention measures today.  Please keep your appointments with your specialists as you may have planned  Please go to the LAB in the Basement (turn left off the elevator) for the tests to be done today  You will be contacted by phone if any changes need to be made immediately.  Otherwise, you will receive a letter about your results with an explanation, but please check with MyChart first.  Please remember to sign up for MyChart if you have not done so, as this will be important to you in the future with finding out test results, communicating by private email, and scheduling acute appointments online when needed.  Please return in 1 year for your yearly visit, or sooner if needed, with Lab testing done 3-5 days before

## 2017-12-06 ENCOUNTER — Encounter: Payer: Self-pay | Admitting: Internal Medicine

## 2017-12-06 ENCOUNTER — Telehealth: Payer: Self-pay | Admitting: Internal Medicine

## 2017-12-06 NOTE — Telephone Encounter (Signed)
Lab printed and mailed.

## 2017-12-06 NOTE — Assessment & Plan Note (Signed)
Ok to change the lovastatin to crestor 40 qd

## 2017-12-06 NOTE — Assessment & Plan Note (Signed)

## 2017-12-06 NOTE — Telephone Encounter (Signed)
Copied from [place]. Topic: Quick Communication - See Telephone Encounter >> Dec 06, 2017  3:03 PM Vernona Rieger wrote: CRM for notification. See Telephone encounter for:   12/06/17. Pt is requesting a copy of his lab work from 12/17. Call back is [phone]

## 2017-12-14 DIAGNOSIS — R339 Retention of urine, unspecified: Secondary | ICD-10-CM | POA: Diagnosis not present

## 2018-02-22 ENCOUNTER — Telehealth (HOSPITAL_COMMUNITY): Payer: Self-pay | Admitting: Pharmacist

## 2018-02-22 NOTE — Telephone Encounter (Signed)
error 

## 2018-03-05 DIAGNOSIS — R339 Retention of urine, unspecified: Secondary | ICD-10-CM | POA: Diagnosis not present

## 2018-03-27 DIAGNOSIS — R339 Retention of urine, unspecified: Secondary | ICD-10-CM | POA: Diagnosis not present

## 2018-05-01 DIAGNOSIS — R339 Retention of urine, unspecified: Secondary | ICD-10-CM | POA: Diagnosis not present

## 2018-05-31 DIAGNOSIS — R339 Retention of urine, unspecified: Secondary | ICD-10-CM | POA: Diagnosis not present

## 2018-06-30 DIAGNOSIS — R339 Retention of urine, unspecified: Secondary | ICD-10-CM | POA: Diagnosis not present

## 2018-07-21 DIAGNOSIS — R339 Retention of urine, unspecified: Secondary | ICD-10-CM | POA: Diagnosis not present

## 2018-08-22 DIAGNOSIS — R339 Retention of urine, unspecified: Secondary | ICD-10-CM | POA: Diagnosis not present

## 2018-09-07 IMAGING — DX DG CHEST 2V
2 series · 2 of 2 positions shown · non-contrast
Comparison: [DATE]

CLINICAL DATA: Persistent cough for 1 week

EXAM:
CHEST  2 VIEW

[chest pa]
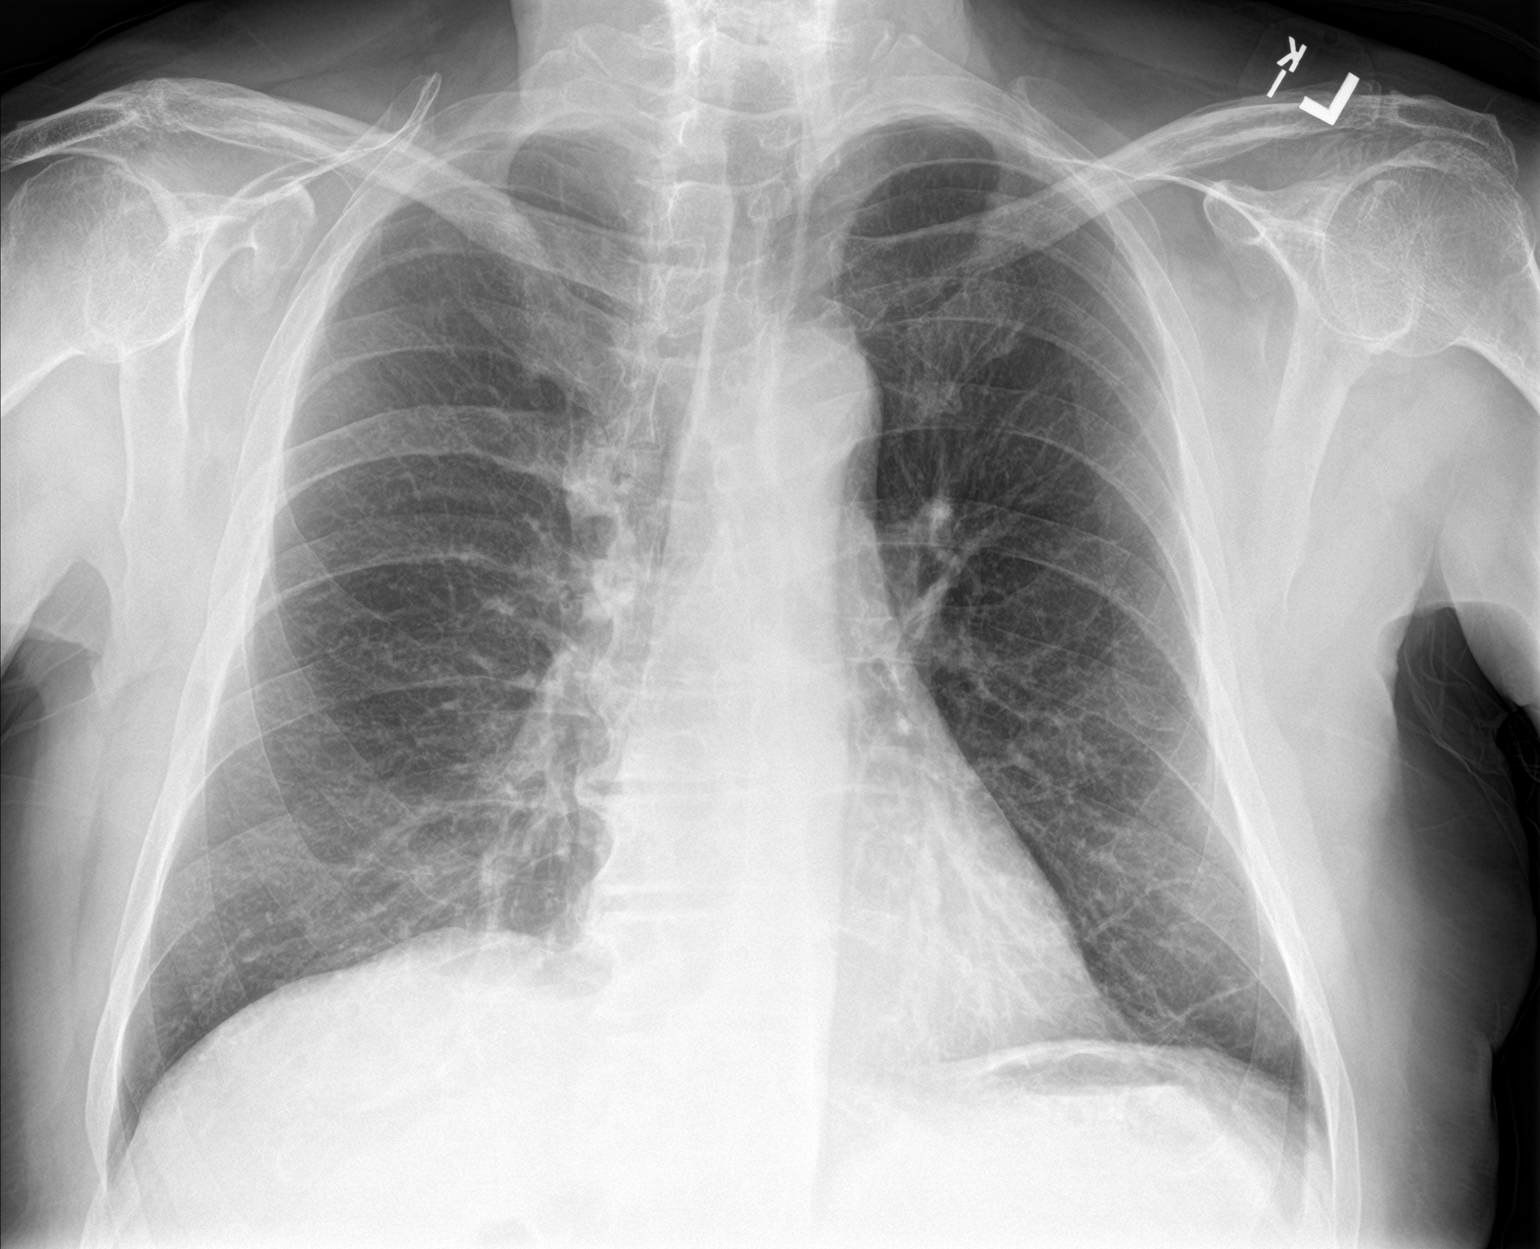

[chest lat]
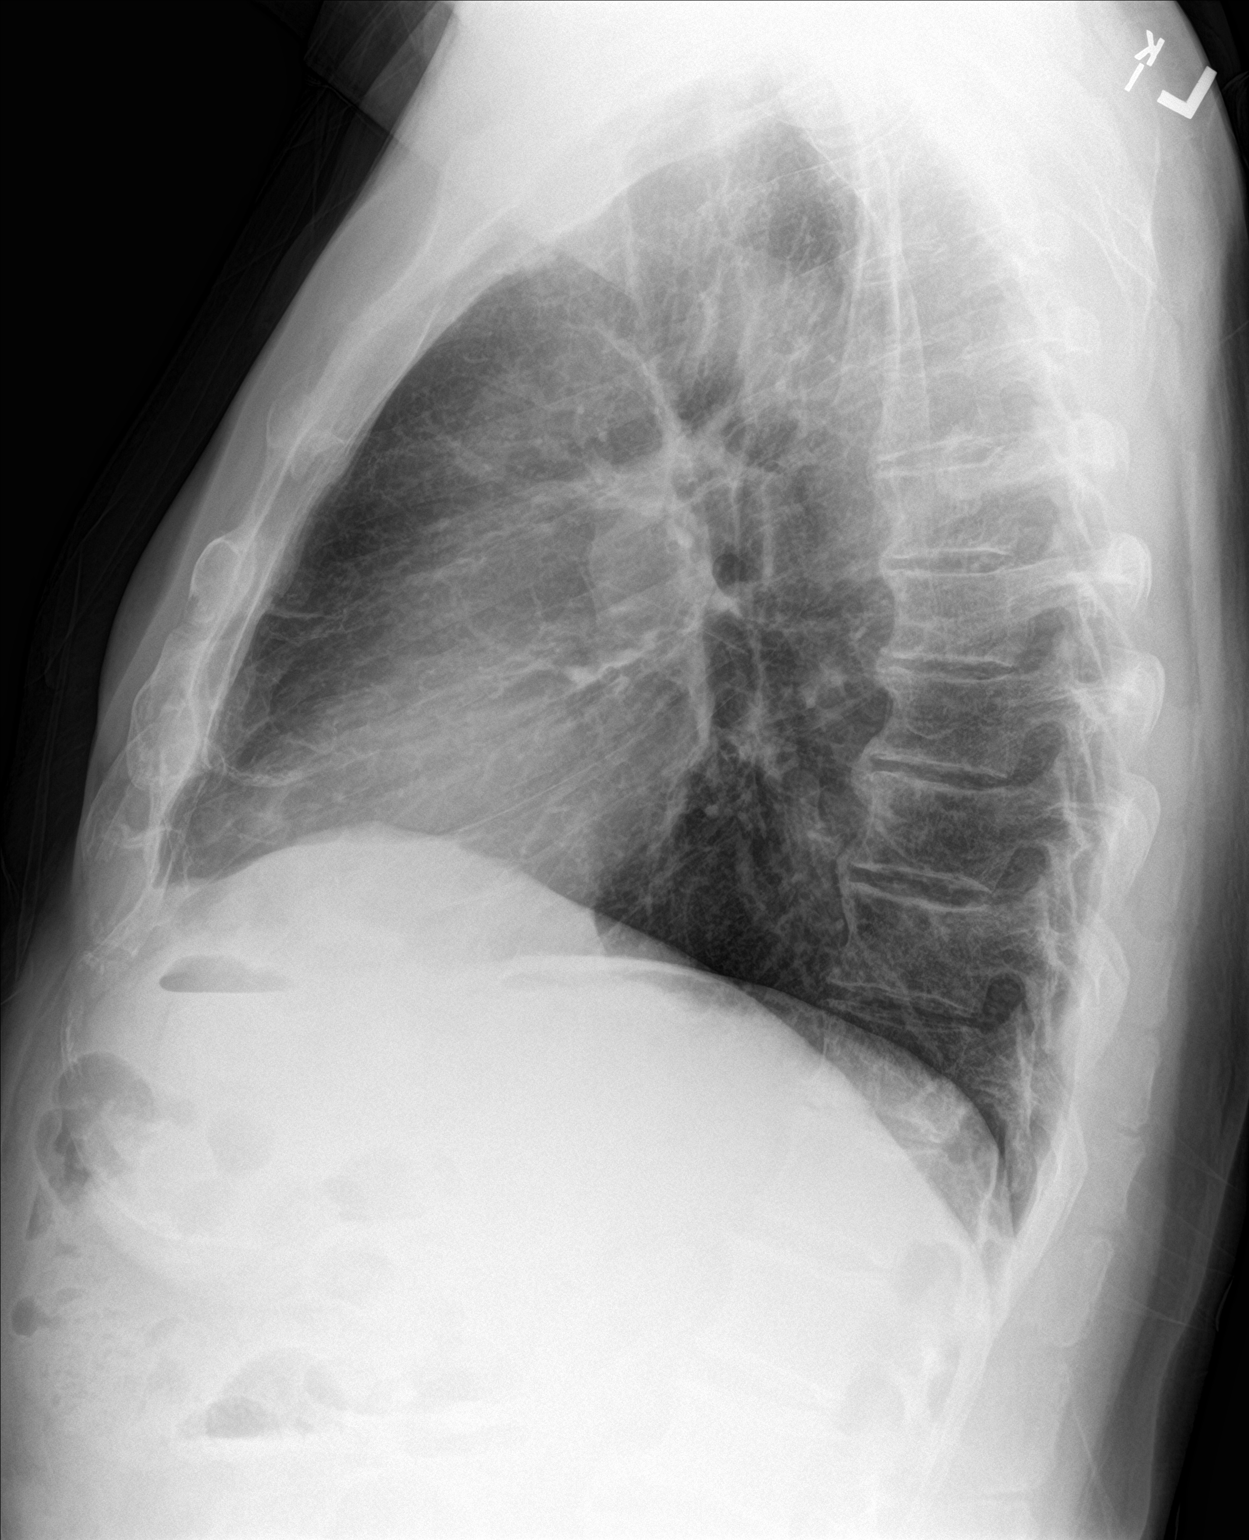

[2 of 2 positions shown; findings below may reference images not displayed]

FINDINGS: Cardiomediastinal silhouette is stable. No infiltrate or pleural
effusion. No pulmonary edema. Degenerative changes thoracic spine.
Hyperinflation again noted. Stable chronic mild interstitial
prominence pre
IMPRESSION: No active cardiopulmonary disease.

## 2018-09-13 DIAGNOSIS — R339 Retention of urine, unspecified: Secondary | ICD-10-CM | POA: Diagnosis not present

## 2018-09-25 ENCOUNTER — Ambulatory Visit (INDEPENDENT_AMBULATORY_CARE_PROVIDER_SITE_OTHER): Payer: Medicare Other | Admitting: *Deleted

## 2018-09-25 DIAGNOSIS — Z23 Encounter for immunization: Secondary | ICD-10-CM

## 2018-09-26 ENCOUNTER — Ambulatory Visit: Payer: Medicare Other

## 2018-10-04 ENCOUNTER — Other Ambulatory Visit: Payer: Self-pay | Admitting: Internal Medicine

## 2018-10-04 DIAGNOSIS — R339 Retention of urine, unspecified: Secondary | ICD-10-CM | POA: Diagnosis not present

## 2018-11-06 DIAGNOSIS — R339 Retention of urine, unspecified: Secondary | ICD-10-CM | POA: Diagnosis not present

## 2018-11-12 ENCOUNTER — Other Ambulatory Visit: Payer: Self-pay | Admitting: Internal Medicine

## 2018-11-27 DIAGNOSIS — R339 Retention of urine, unspecified: Secondary | ICD-10-CM | POA: Diagnosis not present

## 2018-12-06 ENCOUNTER — Other Ambulatory Visit (INDEPENDENT_AMBULATORY_CARE_PROVIDER_SITE_OTHER): Payer: Medicare Other

## 2018-12-06 ENCOUNTER — Ambulatory Visit (INDEPENDENT_AMBULATORY_CARE_PROVIDER_SITE_OTHER): Payer: Medicare Other | Admitting: Internal Medicine

## 2018-12-06 ENCOUNTER — Encounter: Payer: Self-pay | Admitting: Internal Medicine

## 2018-12-06 VITALS — BP 120/72 | HR 77 | Temp 98.0°F | Ht 70.0 in | Wt 211.0 lb

## 2018-12-06 DIAGNOSIS — R7302 Impaired glucose tolerance (oral): Secondary | ICD-10-CM | POA: Diagnosis not present

## 2018-12-06 DIAGNOSIS — Z0000 Encounter for general adult medical examination without abnormal findings: Secondary | ICD-10-CM

## 2018-12-06 LAB — CBC WITH DIFFERENTIAL/PLATELET
Basophils Absolute: 0.1 10*3/uL (ref 0.0–0.1)
Basophils Relative: 0.9 % (ref 0.0–3.0)
EOS ABS: 0.1 10*3/uL (ref 0.0–0.7)
Eosinophils Relative: 2.2 % (ref 0.0–5.0)
HCT: 44.8 % (ref 39.0–52.0)
Hemoglobin: 15.2 g/dL (ref 13.0–17.0)
Lymphocytes Relative: 27 % (ref 12.0–46.0)
Lymphs Abs: 1.5 10*3/uL (ref 0.7–4.0)
MCHC: 34 g/dL (ref 30.0–36.0)
MCV: 92.9 fl (ref 78.0–100.0)
Monocytes Absolute: 0.7 10*3/uL (ref 0.1–1.0)
Monocytes Relative: 12.3 % — ABNORMAL HIGH (ref 3.0–12.0)
Neutro Abs: 3.2 10*3/uL (ref 1.4–7.7)
Neutrophils Relative %: 57.6 % (ref 43.0–77.0)
PLATELETS: 261 10*3/uL (ref 150.0–400.0)
RBC: 4.83 Mil/uL (ref 4.22–5.81)
RDW: 13.9 % (ref 11.5–15.5)
WBC: 5.5 10*3/uL (ref 4.0–10.5)

## 2018-12-06 LAB — BASIC METABOLIC PANEL
BUN: 16 mg/dL (ref 6–23)
CO2: 24 mEq/L (ref 19–32)
Calcium: 9 mg/dL (ref 8.4–10.5)
Chloride: 106 mEq/L (ref 96–112)
Creatinine, Ser: 1.09 mg/dL (ref 0.40–1.50)
GFR: 69.01 mL/min (ref >60.00)
Glucose, Bld: 139 mg/dL — ABNORMAL HIGH (ref 70–99)
Potassium: 3.8 mEq/L (ref 3.5–5.1)
Sodium: 139 mEq/L (ref 135–145)

## 2018-12-06 LAB — LIPID PANEL
Cholesterol: 129 mg/dL (ref 0–200)
HDL: 45 mg/dL (ref >39.00)
LDL Cholesterol: 63 mg/dL (ref 0–99)
NonHDL: 84.12
Total CHOL/HDL Ratio: 3
Triglycerides: 105 mg/dL (ref 0.0–149.0)
VLDL: 21 mg/dL (ref 0.0–40.0)

## 2018-12-06 LAB — HEPATIC FUNCTION PANEL
ALT: 14 U/L (ref 0–53)
AST: 14 U/L (ref 0–37)
Albumin: 4 g/dL (ref 3.5–5.2)
Alkaline Phosphatase: 83 U/L (ref 39–117)
Bilirubin, Direct: 0.1 mg/dL (ref 0.0–0.3)
Total Bilirubin: 0.5 mg/dL (ref 0.2–1.2)
Total Protein: 6.9 g/dL (ref 6.0–8.3)

## 2018-12-06 LAB — HEMOGLOBIN A1C: Hgb A1c MFr Bld: 6.2 % (ref 4.6–6.5)

## 2018-12-06 LAB — TSH: TSH: 1.51 u[IU]/mL (ref 0.35–4.50)

## 2018-12-06 NOTE — Assessment & Plan Note (Signed)
stable overall by history and exam, recent data reviewed with pt, and pt to continue medical treatment as before,  to f/u any worsening symptoms or concerns  

## 2018-12-06 NOTE — Patient Instructions (Signed)

## 2018-12-06 NOTE — Assessment & Plan Note (Signed)
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
## 2018-12-12 ENCOUNTER — Encounter: Payer: Medicare Other | Admitting: Internal Medicine

## 2019-01-10 ENCOUNTER — Ambulatory Visit: Payer: Medicare Other | Admitting: Internal Medicine

## 2019-02-22 DIAGNOSIS — R339 Retention of urine, unspecified: Secondary | ICD-10-CM | POA: Diagnosis not present

## 2019-03-23 DIAGNOSIS — R339 Retention of urine, unspecified: Secondary | ICD-10-CM | POA: Diagnosis not present

## 2019-04-06 DIAGNOSIS — L814 Other melanin hyperpigmentation: Secondary | ICD-10-CM | POA: Diagnosis not present

## 2019-04-06 DIAGNOSIS — L821 Other seborrheic keratosis: Secondary | ICD-10-CM | POA: Diagnosis not present

## 2019-04-06 DIAGNOSIS — D1801 Hemangioma of skin and subcutaneous tissue: Secondary | ICD-10-CM | POA: Diagnosis not present

## 2019-04-06 DIAGNOSIS — D485 Neoplasm of uncertain behavior of skin: Secondary | ICD-10-CM | POA: Diagnosis not present

## 2019-04-06 DIAGNOSIS — C44329 Squamous cell carcinoma of skin of other parts of face: Secondary | ICD-10-CM | POA: Diagnosis not present

## 2019-04-19 DIAGNOSIS — C44329 Squamous cell carcinoma of skin of other parts of face: Secondary | ICD-10-CM | POA: Diagnosis not present

## 2019-04-19 DIAGNOSIS — L820 Inflamed seborrheic keratosis: Secondary | ICD-10-CM | POA: Diagnosis not present

## 2019-04-19 DIAGNOSIS — D485 Neoplasm of uncertain behavior of skin: Secondary | ICD-10-CM | POA: Diagnosis not present

## 2019-05-02 DIAGNOSIS — R339 Retention of urine, unspecified: Secondary | ICD-10-CM | POA: Diagnosis not present

## 2019-05-09 DIAGNOSIS — N453 Epididymo-orchitis: Secondary | ICD-10-CM | POA: Diagnosis not present

## 2019-05-09 DIAGNOSIS — R310 Gross hematuria: Secondary | ICD-10-CM | POA: Diagnosis not present

## 2019-05-09 DIAGNOSIS — N3000 Acute cystitis without hematuria: Secondary | ICD-10-CM | POA: Diagnosis not present

## 2019-06-04 DIAGNOSIS — R339 Retention of urine, unspecified: Secondary | ICD-10-CM | POA: Diagnosis not present

## 2019-06-11 DIAGNOSIS — N3000 Acute cystitis without hematuria: Secondary | ICD-10-CM | POA: Diagnosis not present

## 2019-06-11 DIAGNOSIS — N453 Epididymo-orchitis: Secondary | ICD-10-CM | POA: Diagnosis not present

## 2019-06-29 DIAGNOSIS — R339 Retention of urine, unspecified: Secondary | ICD-10-CM | POA: Diagnosis not present

## 2019-07-07 ENCOUNTER — Other Ambulatory Visit: Payer: Self-pay | Admitting: Internal Medicine

## 2019-07-17 DIAGNOSIS — N453 Epididymo-orchitis: Secondary | ICD-10-CM | POA: Diagnosis not present

## 2019-07-27 DIAGNOSIS — R339 Retention of urine, unspecified: Secondary | ICD-10-CM | POA: Diagnosis not present

## 2019-09-05 DIAGNOSIS — R339 Retention of urine, unspecified: Secondary | ICD-10-CM | POA: Diagnosis not present

## 2019-09-14 ENCOUNTER — Other Ambulatory Visit: Payer: Self-pay

## 2019-09-14 ENCOUNTER — Ambulatory Visit (INDEPENDENT_AMBULATORY_CARE_PROVIDER_SITE_OTHER): Payer: Medicare Other

## 2019-09-14 ENCOUNTER — Other Ambulatory Visit: Payer: Self-pay | Admitting: Internal Medicine

## 2019-09-14 DIAGNOSIS — Z23 Encounter for immunization: Secondary | ICD-10-CM | POA: Diagnosis not present

## 2019-10-10 DIAGNOSIS — R339 Retention of urine, unspecified: Secondary | ICD-10-CM | POA: Diagnosis not present

## 2019-10-16 ENCOUNTER — Other Ambulatory Visit: Payer: Self-pay

## 2019-10-16 NOTE — Patient Outreach (Signed)
[facility]) Care Management  10/16/2019  Daniel Rowland [birth year] [id number]   Medication Adherence call to Mr. Daniel Rowland HIPPA Compliant Voice message left with a call back number. Mr. Daniel Rowland is showing past due on Lovastatin 40 mg under [place].   [place] Management Direct Dial [phone]  Fax [phone] [email or web address]

## 2019-11-13 DIAGNOSIS — R339 Retention of urine, unspecified: Secondary | ICD-10-CM | POA: Diagnosis not present

## 2019-11-19 ENCOUNTER — Telehealth: Payer: Self-pay | Admitting: Internal Medicine

## 2019-11-19 NOTE — Telephone Encounter (Signed)
Patient wife's is calling to see if Dr. Jenny Reichmann has any samples for the generic NEXIUM 40 MG capsule [id number]  Or if Dr. Jenny Reichmann can call in a short term script for the generic nexium. The medication is expensive for the patient. Please advise.  Preferred Pharmacy-[facility] [phone]

## 2019-11-21 MED ORDER — ESOMEPRAZOLE MAGNESIUM 40 MG PO CPDR: DELAYED_RELEASE_CAPSULE | ORAL | 1 refills | Status: DC

## 2019-11-21 NOTE — Addendum Note (Signed)
Addended by: Biagio Borg on: 11/21/2019 11:56 AM   Modules accepted: Orders

## 2019-11-21 NOTE — Telephone Encounter (Signed)
Done erx 

## 2019-11-21 NOTE — Telephone Encounter (Signed)
We do not have samples. Please advise.

## 2019-12-11 ENCOUNTER — Other Ambulatory Visit: Payer: Self-pay

## 2019-12-11 ENCOUNTER — Encounter: Payer: Self-pay | Admitting: Internal Medicine

## 2019-12-11 ENCOUNTER — Ambulatory Visit (INDEPENDENT_AMBULATORY_CARE_PROVIDER_SITE_OTHER): Payer: Medicare Other | Admitting: Internal Medicine

## 2019-12-11 VITALS — BP 138/76 | HR 80 | Temp 98.4°F | Ht 68.5 in | Wt 210.0 lb

## 2019-12-11 DIAGNOSIS — E559 Vitamin D deficiency, unspecified: Secondary | ICD-10-CM

## 2019-12-11 DIAGNOSIS — E538 Deficiency of other specified B group vitamins: Secondary | ICD-10-CM

## 2019-12-11 DIAGNOSIS — R7302 Impaired glucose tolerance (oral): Secondary | ICD-10-CM | POA: Diagnosis not present

## 2019-12-11 DIAGNOSIS — Z0000 Encounter for general adult medical examination without abnormal findings: Secondary | ICD-10-CM

## 2019-12-11 DIAGNOSIS — E611 Iron deficiency: Secondary | ICD-10-CM | POA: Diagnosis not present

## 2019-12-11 DIAGNOSIS — Z23 Encounter for immunization: Secondary | ICD-10-CM | POA: Diagnosis not present

## 2019-12-11 LAB — VITAMIN D 25 HYDROXY (VIT D DEFICIENCY, FRACTURES): VITD: 25.7 ng/mL — ABNORMAL LOW (ref 30.00–100.00)

## 2019-12-11 LAB — CBC WITH DIFFERENTIAL/PLATELET
Basophils Absolute: 0 10*3/uL (ref 0.0–0.1)
Basophils Relative: 0.8 % (ref 0.0–3.0)
Eosinophils Absolute: 0.1 10*3/uL (ref 0.0–0.7)
Eosinophils Relative: 2.3 % (ref 0.0–5.0)
HCT: 43.8 % (ref 39.0–52.0)
Hemoglobin: 14.5 g/dL (ref 13.0–17.0)
Lymphocytes Relative: 25.9 % (ref 12.0–46.0)
Lymphs Abs: 1.4 10*3/uL (ref 0.7–4.0)
MCHC: 33 g/dL (ref 30.0–36.0)
MCV: 94 fl (ref 78.0–100.0)
Monocytes Absolute: 0.7 10*3/uL (ref 0.1–1.0)
Monocytes Relative: 12.2 % — ABNORMAL HIGH (ref 3.0–12.0)
Neutro Abs: 3.2 10*3/uL (ref 1.4–7.7)
Neutrophils Relative %: 58.8 % (ref 43.0–77.0)
Platelets: 264 10*3/uL (ref 150.0–400.0)
RBC: 4.66 Mil/uL (ref 4.22–5.81)
RDW: 14.1 % (ref 11.5–15.5)
WBC: 5.5 10*3/uL (ref 4.0–10.5)

## 2019-12-11 LAB — BASIC METABOLIC PANEL
BUN: 15 mg/dL (ref 6–23)
CO2: 26 mEq/L (ref 19–32)
Calcium: 8.9 mg/dL (ref 8.4–10.5)
Chloride: 106 mEq/L (ref 96–112)
Creatinine, Ser: 1.03 mg/dL (ref 0.40–1.50)
GFR: 69.14 mL/min (ref >60.00)
Glucose, Bld: 157 mg/dL — ABNORMAL HIGH (ref 70–99)
Potassium: 4.1 mEq/L (ref 3.5–5.1)
Sodium: 139 mEq/L (ref 135–145)

## 2019-12-11 LAB — TSH: TSH: 1.96 u[IU]/mL (ref 0.35–4.50)

## 2019-12-11 LAB — LIPID PANEL
Cholesterol: 125 mg/dL (ref 0–200)
HDL: 40.9 mg/dL (ref >39.00)
LDL Cholesterol: 65 mg/dL (ref 0–99)
NonHDL: 84.36
Total CHOL/HDL Ratio: 3
Triglycerides: 95 mg/dL (ref 0.0–149.0)
VLDL: 19 mg/dL (ref 0.0–40.0)

## 2019-12-11 LAB — HEPATIC FUNCTION PANEL
ALT: 17 U/L (ref 0–53)
AST: 18 U/L (ref 0–37)
Albumin: 4 g/dL (ref 3.5–5.2)
Alkaline Phosphatase: 90 U/L (ref 39–117)
Bilirubin, Direct: 0.1 mg/dL (ref 0.0–0.3)
Total Bilirubin: 0.5 mg/dL (ref 0.2–1.2)
Total Protein: 6.5 g/dL (ref 6.0–8.3)

## 2019-12-11 LAB — IBC PANEL
Iron: 72 ug/dL (ref 42–165)
Saturation Ratios: 28 % (ref 20.0–50.0)
Transferrin: 184 mg/dL — ABNORMAL LOW (ref 212.0–360.0)

## 2019-12-11 LAB — HEMOGLOBIN A1C: Hgb A1c MFr Bld: 6.2 % (ref 4.6–6.5)

## 2019-12-11 LAB — VITAMIN B12: Vitamin B-12: 1054 pg/mL — ABNORMAL HIGH (ref 211–911)

## 2019-12-11 MED ORDER — PANTOPRAZOLE SODIUM 40 MG PO TBEC: 40.0000 mg | DELAYED_RELEASE_TABLET | Freq: Every day | ORAL | 3 refills | Status: DC

## 2019-12-11 MED ORDER — LOVASTATIN 40 MG PO TABS: ORAL_TABLET | ORAL | 3 refills | Status: DC

## 2019-12-11 NOTE — Patient Instructions (Addendum)
You had the Tdap today  Ok to change the nexium to protonix 40 mg per day  Please continue all other medications as before, and refills have been done if requested.  Please have the pharmacy call with any other refills you may need.  Please continue your efforts at being more active, low cholesterol diet, and weight control.  You are otherwise up to date with prevention measures today.  Please keep your appointments with your specialists as you may have planned  Please go to the LAB at the blood drawing area for the tests to be done  You will be contacted by phone if any changes need to be made immediately.  Otherwise, you will receive a letter about your results with an explanation, but please check with MyChart first.  Please remember to sign up for MyChart if you have not done so, as this will be important to you in the future with finding out test results, communicating by private email, and scheduling acute appointments online when needed.  Please return in 1 year for your yearly visit, or sooner if needed
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
## 2019-12-12 ENCOUNTER — Encounter: Payer: Self-pay | Admitting: Internal Medicine

## 2019-12-12 ENCOUNTER — Other Ambulatory Visit: Payer: Self-pay | Admitting: Internal Medicine

## 2019-12-12 LAB — URINALYSIS, ROUTINE W REFLEX MICROSCOPIC
Bilirubin Urine: NEGATIVE
Hgb urine dipstick: NEGATIVE
Ketones, ur: NEGATIVE
Leukocytes,Ua: NEGATIVE
Nitrite: NEGATIVE
RBC / HPF: NONE SEEN (ref 0–?)
Specific Gravity, Urine: 1.02 (ref 1.000–1.030)
Total Protein, Urine: NEGATIVE
Urine Glucose: NEGATIVE
Urobilinogen, UA: 0.2 (ref 0.0–1.0)
pH: 5.5 (ref 5.0–8.0)

## 2019-12-12 MED ORDER — VITAMIN D (ERGOCALCIFEROL) 1.25 MG (50000 UNIT) PO CAPS: 50000.0000 [IU] | ORAL_CAPSULE | ORAL | 0 refills | Status: DC

## 2019-12-21 ENCOUNTER — Encounter: Payer: Self-pay | Admitting: Internal Medicine

## 2019-12-21 NOTE — Assessment & Plan Note (Signed)

## 2019-12-21 NOTE — Assessment & Plan Note (Signed)
stable overall by history and exam, recent data reviewed with pt, and pt to continue medical treatment as before,  to f/u any worsening symptoms or concerns  

## 2020-03-13 ENCOUNTER — Telehealth: Payer: Self-pay | Admitting: Internal Medicine

## 2020-03-13 NOTE — Telephone Encounter (Signed)
New message:   Pt is having some sharp pains on his left side from his temple to his ear. Pt's wife states he had caterac surgery a while ago but not sure if this is the reason for his pain or not. She states it last about 30 seconds and then it's gone. I offered an appt but patient wanted to see what the Dr says first Please advise.

## 2020-03-17 NOTE — Telephone Encounter (Signed)
This is very highly unlikely to be related to recent cataract.  Perhaps a question to the ophthalmologist would help as well, but I have little to o/w offer given this limited information

## 2020-03-17 NOTE — Telephone Encounter (Signed)
Please advise 

## 2020-06-10 ENCOUNTER — Telehealth: Payer: Self-pay | Admitting: Internal Medicine

## 2020-06-10 DIAGNOSIS — E559 Vitamin D deficiency, unspecified: Secondary | ICD-10-CM

## 2020-06-10 DIAGNOSIS — R739 Hyperglycemia, unspecified: Secondary | ICD-10-CM

## 2020-06-10 DIAGNOSIS — Z0000 Encounter for general adult medical examination without abnormal findings: Secondary | ICD-10-CM

## 2020-06-10 NOTE — Telephone Encounter (Signed)
New message:   Pt is requesting lab work to be put in for a few days before his physical on 12/11/20. Please advise.

## 2020-06-10 NOTE — Telephone Encounter (Signed)
Labs ordered.

## 2020-06-13 NOTE — Telephone Encounter (Signed)
Pt informed labs have been ordered to be completed prior to appointment.

## 2020-10-06 ENCOUNTER — Telehealth: Payer: Self-pay | Admitting: Internal Medicine

## 2020-10-06 DIAGNOSIS — K429 Umbilical hernia without obstruction or gangrene: Secondary | ICD-10-CM

## 2020-10-06 NOTE — Telephone Encounter (Signed)
Patients wife stating that patients hernia is getting worse on his belly button and wondering what they should do and if it was possible to get referred to a general surgeon Can be contacted @ [phone]

## 2020-10-07 NOTE — Telephone Encounter (Signed)
Sent to Dr. John to advise. 

## 2020-10-07 NOTE — Telephone Encounter (Signed)
Ok referral done 

## 2020-12-11 ENCOUNTER — Other Ambulatory Visit (INDEPENDENT_AMBULATORY_CARE_PROVIDER_SITE_OTHER): Payer: Medicare Other

## 2020-12-11 ENCOUNTER — Encounter: Payer: Medicare Other | Admitting: Internal Medicine

## 2020-12-11 DIAGNOSIS — E559 Vitamin D deficiency, unspecified: Secondary | ICD-10-CM

## 2020-12-11 DIAGNOSIS — R739 Hyperglycemia, unspecified: Secondary | ICD-10-CM

## 2020-12-11 DIAGNOSIS — Z0000 Encounter for general adult medical examination without abnormal findings: Secondary | ICD-10-CM

## 2020-12-11 LAB — LIPID PANEL
Cholesterol: 121 mg/dL (ref 0–200)
HDL: 44.6 mg/dL (ref >39.00)
LDL Cholesterol: 61 mg/dL (ref 0–99)
NonHDL: 76.14
Total CHOL/HDL Ratio: 3
Triglycerides: 77 mg/dL (ref 0.0–149.0)
VLDL: 15.4 mg/dL (ref 0.0–40.0)

## 2020-12-11 LAB — CBC WITH DIFFERENTIAL/PLATELET
Basophils Absolute: 0 10*3/uL (ref 0.0–0.1)
Basophils Relative: 0.8 % (ref 0.0–3.0)
Eosinophils Absolute: 0.1 10*3/uL (ref 0.0–0.7)
Eosinophils Relative: 1.2 % (ref 0.0–5.0)
HCT: 43.5 % (ref 39.0–52.0)
Hemoglobin: 14.7 g/dL (ref 13.0–17.0)
Lymphocytes Relative: 20.2 % (ref 12.0–46.0)
Lymphs Abs: 1.1 10*3/uL (ref 0.7–4.0)
MCHC: 33.8 g/dL (ref 30.0–36.0)
MCV: 93.1 fl (ref 78.0–100.0)
Monocytes Absolute: 0.7 10*3/uL (ref 0.1–1.0)
Monocytes Relative: 12.1 % — ABNORMAL HIGH (ref 3.0–12.0)
Neutro Abs: 3.7 10*3/uL (ref 1.4–7.7)
Neutrophils Relative %: 65.7 % (ref 43.0–77.0)
Platelets: 276 10*3/uL (ref 150.0–400.0)
RBC: 4.67 Mil/uL (ref 4.22–5.81)
RDW: 13.8 % (ref 11.5–15.5)
WBC: 5.7 10*3/uL (ref 4.0–10.5)

## 2020-12-11 LAB — BASIC METABOLIC PANEL
BUN: 16 mg/dL (ref 6–23)
CO2: 29 mEq/L (ref 19–32)
Calcium: 9.3 mg/dL (ref 8.4–10.5)
Chloride: 107 mEq/L (ref 96–112)
Creatinine, Ser: 1.12 mg/dL (ref 0.40–1.50)
GFR: 60.96 mL/min (ref >60.00)
Glucose, Bld: 102 mg/dL — ABNORMAL HIGH (ref 70–99)
Potassium: 4.9 mEq/L (ref 3.5–5.1)
Sodium: 141 mEq/L (ref 135–145)

## 2020-12-11 LAB — HEPATIC FUNCTION PANEL
ALT: 18 U/L (ref 0–53)
AST: 18 U/L (ref 0–37)
Albumin: 4.1 g/dL (ref 3.5–5.2)
Alkaline Phosphatase: 101 U/L (ref 39–117)
Bilirubin, Direct: 0.1 mg/dL (ref 0.0–0.3)
Total Bilirubin: 0.6 mg/dL (ref 0.2–1.2)
Total Protein: 7.2 g/dL (ref 6.0–8.3)

## 2020-12-11 LAB — HEMOGLOBIN A1C: Hgb A1c MFr Bld: 6.2 % (ref 4.6–6.5)

## 2020-12-11 LAB — TSH: TSH: 1.74 u[IU]/mL (ref 0.35–4.50)

## 2020-12-11 LAB — VITAMIN D 25 HYDROXY (VIT D DEFICIENCY, FRACTURES): VITD: 49.47 ng/mL (ref 30.00–100.00)

## 2020-12-18 ENCOUNTER — Other Ambulatory Visit: Payer: Self-pay | Admitting: Internal Medicine

## 2020-12-18 NOTE — Telephone Encounter (Signed)
Please refill as per office routine med refill policy (all routine meds refilled for 3 mo or monthly per pt preference up to one year from last visit, then month to month grace period for 3 mo, then further med refills will have to be denied)  

## 2020-12-23 ENCOUNTER — Other Ambulatory Visit: Payer: Self-pay

## 2020-12-23 ENCOUNTER — Encounter: Payer: Self-pay | Admitting: Internal Medicine

## 2020-12-23 ENCOUNTER — Ambulatory Visit (INDEPENDENT_AMBULATORY_CARE_PROVIDER_SITE_OTHER): Payer: Medicare Other | Admitting: Internal Medicine

## 2020-12-23 VITALS — BP 146/80 | HR 71 | Temp 98.0°F | Ht 67.5 in | Wt 205.2 lb

## 2020-12-23 DIAGNOSIS — Z0000 Encounter for general adult medical examination without abnormal findings: Secondary | ICD-10-CM | POA: Diagnosis not present

## 2020-12-23 DIAGNOSIS — R030 Elevated blood-pressure reading, without diagnosis of hypertension: Secondary | ICD-10-CM

## 2020-12-23 DIAGNOSIS — K219 Gastro-esophageal reflux disease without esophagitis: Secondary | ICD-10-CM

## 2020-12-23 DIAGNOSIS — R7303 Prediabetes: Secondary | ICD-10-CM

## 2020-12-23 DIAGNOSIS — F32A Depression, unspecified: Secondary | ICD-10-CM | POA: Insufficient documentation

## 2020-12-23 DIAGNOSIS — E7849 Other hyperlipidemia: Secondary | ICD-10-CM

## 2020-12-23 DIAGNOSIS — E559 Vitamin D deficiency, unspecified: Secondary | ICD-10-CM | POA: Insufficient documentation

## 2020-12-23 DIAGNOSIS — F3289 Other specified depressive episodes: Secondary | ICD-10-CM

## 2020-12-23 DIAGNOSIS — Z0001 Encounter for general adult medical examination with abnormal findings: Secondary | ICD-10-CM

## 2020-12-23 NOTE — Assessment & Plan Note (Signed)
Mild elevated, to check daily for 10 days and call with results to consider BP med tx

## 2020-12-23 NOTE — Patient Instructions (Signed)
Please check your Blood Pressure once per day for 10 days, and let us know the average by sending a message on Mychart (or you could call with a message as well)  Please continue all other medications as before, and refills have been done if requested.  Please have the pharmacy call with any other refills you may need.  Please continue your efforts at being more active, low cholesterol diet, and weight control.  You are otherwise up to date with prevention measures today.  Please keep your appointments with your specialists as you may have planned  Please make an Appointment to return in 6 months, or sooner if needed

## 2020-12-23 NOTE — Assessment & Plan Note (Signed)
Continue oral replacement.  

## 2020-12-23 NOTE — Assessment & Plan Note (Signed)
Lab Results  Component Value Date   LDLCALC 61 12/11/2020  stable, to continue lipitor

## 2020-12-23 NOTE — Assessment & Plan Note (Signed)
Stable, cont protonix ?

## 2020-12-23 NOTE — Assessment & Plan Note (Signed)

## 2020-12-23 NOTE — Assessment & Plan Note (Signed)
Lab Results  Component Value Date   HGBA1C 6.2 12/11/2020  to continue diet and wt control, currently takes no rx meds
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
## 2020-12-23 NOTE — Assessment & Plan Note (Signed)
Mild situational, declines need for counseling referral, or med tx

## 2021-01-18 ENCOUNTER — Other Ambulatory Visit: Payer: Self-pay | Admitting: Internal Medicine

## 2021-01-18 NOTE — Telephone Encounter (Signed)
Please refill as per office routine med refill policy (all routine meds refilled for 3 mo or monthly per pt preference up to one year from last visit, then month to month grace period for 3 mo, then further med refills will have to be denied)  

## 2021-05-19 ENCOUNTER — Telehealth: Payer: Self-pay | Admitting: Internal Medicine

## 2021-05-19 NOTE — Telephone Encounter (Signed)
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

## 2021-05-19 NOTE — Telephone Encounter (Signed)
Patients wife called and was wondering if the patient needed to get the second Covid [employer] booster shot. She said that he got his first one in October. She said that they are going on a cruise soon and would be around a lot of people. She can be reached at [phone]. Please advise

## 2021-05-20 NOTE — Telephone Encounter (Signed)
Tried calling patient, no answer, unable to leave message. 

## 2021-05-22 NOTE — Telephone Encounter (Signed)
Patient has been notified

## 2021-06-23 ENCOUNTER — Ambulatory Visit: Payer: Medicare Other | Admitting: Internal Medicine

## 2021-06-25 ENCOUNTER — Ambulatory Visit: Payer: Medicare Other | Admitting: Internal Medicine

## 2021-06-26 ENCOUNTER — Telehealth: Payer: Self-pay | Admitting: Internal Medicine

## 2021-06-26 NOTE — Telephone Encounter (Signed)
I cannot make recommendations without more information, please consider OV or UC visit

## 2021-06-26 NOTE — Telephone Encounter (Signed)
Called and spoke with pt wife, she states that the pt is feeling fatigue. No respiratory issues. Pt wife states she gives the pt ibuprofen to break fever and ginger ale. She declines an OV at this time and will update the office of pt symptoms if he gets worse.

## 2021-06-26 NOTE — Telephone Encounter (Signed)
   Spouse calling to report patient is COVID+ Seeking advice for fever and cold symptoms Please call

## 2021-10-02 ENCOUNTER — Ambulatory Visit: Payer: Medicare Other

## 2021-10-21 ENCOUNTER — Encounter (HOSPITAL_COMMUNITY): Payer: Self-pay

## 2021-10-21 ENCOUNTER — Emergency Department (HOSPITAL_COMMUNITY): Payer: Medicare Other

## 2021-10-21 ENCOUNTER — Other Ambulatory Visit: Payer: Self-pay

## 2021-10-21 ENCOUNTER — Emergency Department (HOSPITAL_COMMUNITY)
Admission: EM | Admit: 2021-10-21 | Discharge: 2021-10-21 | Disposition: A | Discharge status: Home / Self Care | Payer: Medicare Other | Attending: Emergency Medicine | Admitting: Emergency Medicine

## 2021-10-21 DIAGNOSIS — Z8546 Personal history of malignant neoplasm of prostate: Secondary | ICD-10-CM | POA: Insufficient documentation

## 2021-10-21 DIAGNOSIS — W01198A Fall on same level from slipping, tripping and stumbling with subsequent striking against other object, initial encounter: Secondary | ICD-10-CM | POA: Insufficient documentation

## 2021-10-21 DIAGNOSIS — Z87891 Personal history of nicotine dependence: Secondary | ICD-10-CM | POA: Diagnosis not present

## 2021-10-21 DIAGNOSIS — Z7982 Long term (current) use of aspirin: Secondary | ICD-10-CM | POA: Diagnosis not present

## 2021-10-21 DIAGNOSIS — I10 Essential (primary) hypertension: Secondary | ICD-10-CM | POA: Diagnosis not present

## 2021-10-21 DIAGNOSIS — R109 Unspecified abdominal pain: Secondary | ICD-10-CM | POA: Diagnosis present

## 2021-10-21 DIAGNOSIS — W19XXXA Unspecified fall, initial encounter: Secondary | ICD-10-CM

## 2021-10-21 LAB — COMPREHENSIVE METABOLIC PANEL
ALT: 16 U/L (ref 0–44)
AST: 20 U/L (ref 15–41)
Albumin: 4.1 g/dL (ref 3.5–5.0)
Alkaline Phosphatase: 97 U/L (ref 38–126)
Anion gap: 8 (ref 5–15)
BUN: 13 mg/dL (ref 8–23)
CO2: 23 mmol/L (ref 22–32)
Calcium: 8.8 mg/dL — ABNORMAL LOW (ref 8.9–10.3)
Chloride: 109 mmol/L (ref 98–111)
Creatinine, Ser: 1.09 mg/dL (ref 0.61–1.24)
GFR, Estimated: >60 mL/min (ref >60)
Glucose, Bld: 151 mg/dL — ABNORMAL HIGH (ref 70–99)
Potassium: 3.8 mmol/L (ref 3.5–5.1)
Sodium: 140 mmol/L (ref 135–145)
Total Bilirubin: 1 mg/dL (ref 0.3–1.2)
Total Protein: 7.3 g/dL (ref 6.5–8.1)

## 2021-10-21 LAB — CBC WITH DIFFERENTIAL/PLATELET
Abs Immature Granulocytes: 0.02 10*3/uL (ref 0.00–0.07)
Basophils Absolute: 0 10*3/uL (ref 0.0–0.1)
Basophils Relative: 0 %
Eosinophils Absolute: 0.1 10*3/uL (ref 0.0–0.5)
Eosinophils Relative: 1 %
HCT: 46.3 % (ref 39.0–52.0)
Hemoglobin: 15.5 g/dL (ref 13.0–17.0)
Immature Granulocytes: 0 %
Lymphocytes Relative: 13 %
Lymphs Abs: 1 10*3/uL (ref 0.7–4.0)
MCH: 31.1 pg (ref 26.0–34.0)
MCHC: 33.5 g/dL (ref 30.0–36.0)
MCV: 92.8 fL (ref 80.0–100.0)
Monocytes Absolute: 0.7 10*3/uL (ref 0.1–1.0)
Monocytes Relative: 10 %
Neutro Abs: 6 10*3/uL (ref 1.7–7.7)
Neutrophils Relative %: 76 %
Platelets: 267 10*3/uL (ref 150–400)
RBC: 4.99 MIL/uL (ref 4.22–5.81)
RDW: 13.6 % (ref 11.5–15.5)
WBC: 7.8 10*3/uL (ref 4.0–10.5)
nRBC: 0 % (ref 0.0–0.2)

## 2021-10-21 LAB — URINALYSIS, ROUTINE W REFLEX MICROSCOPIC
Bilirubin Urine: NEGATIVE
Glucose, UA: NEGATIVE mg/dL
Ketones, ur: NEGATIVE mg/dL
Nitrite: NEGATIVE
Protein, ur: 100 mg/dL — AB
RBC / HPF: >50 RBC/hpf — ABNORMAL HIGH (ref 0–5)
Specific Gravity, Urine: 1.034 — ABNORMAL HIGH (ref 1.005–1.030)
pH: 6 (ref 5.0–8.0)

## 2021-10-21 LAB — MAGNESIUM: Magnesium: 2.3 mg/dL (ref 1.7–2.4)

## 2021-10-21 MED ORDER — IOHEXOL 350 MG/ML SOLN
75.0000 mL | Freq: Once | INTRAVENOUS | Status: AC | PRN
  Administered 2021-10-21: 80 mL via INTRAVENOUS

## 2021-10-21 MED ORDER — KETOROLAC TROMETHAMINE 15 MG/ML IJ SOLN
15.0000 mg | Freq: Once | INTRAMUSCULAR | Status: AC
  Administered 2021-10-21: 15 mg via INTRAVENOUS
  Filled 2021-10-21: qty 1

## 2021-10-21 MED ORDER — METHOCARBAMOL 500 MG PO TABS
1000.0000 mg | ORAL_TABLET | Freq: Once | ORAL | Status: AC
  Administered 2021-10-21: 1000 mg via ORAL
  Filled 2021-10-21: qty 2

## 2021-10-21 MED ORDER — OXYCODONE HCL 5 MG PO TABS: 5.0000 mg | ORAL_TABLET | ORAL | 0 refills | Status: AC | PRN

## 2021-10-21 MED ORDER — SODIUM CHLORIDE (PF) 0.9 % IJ SOLN
INTRAMUSCULAR | Status: AC
  Filled 2021-10-21: qty 50

## 2021-10-21 MED ORDER — HYDROMORPHONE HCL 1 MG/ML IJ SOLN
0.5000 mg | Freq: Once | INTRAMUSCULAR | Status: AC
  Administered 2021-10-21: 0.5 mg via INTRAVENOUS
  Filled 2021-10-21: qty 1

## 2021-10-21 MED ORDER — LACTATED RINGERS IV BOLUS
500.0000 mL | Freq: Once | INTRAVENOUS | Status: AC
  Administered 2021-10-21: 500 mL via INTRAVENOUS

## 2021-10-21 MED ORDER — METHOCARBAMOL 500 MG PO TABS: 500.0000 mg | ORAL_TABLET | Freq: Two times a day (BID) | ORAL | 0 refills | Status: AC

## 2021-10-21 NOTE — ED Notes (Signed)
Pt reports self catheretization d/t prostate CA QID.  Wife reports it is only small amounts of urine returned.  States last cath was 0400 may have more urine by "Lunch time."  Aware of need for urine specimen.

## 2021-10-21 NOTE — Discharge Instructions (Signed)
Take ibuprofen and Tylenol for management of pain.  Additionally, there are prescriptions sent to your pharmacy for 2 additional pain medications.  Robaxin is a muscle relaxer.  Oxycodone is a narcotic.  Either 1 of these can put you at risk for additional falls.  Taken together, they would put you at significant risk for an additional fall.  Please take only as needed and with caution.  Continue to drink plenty of fluids.  The blood in your urine should improve.  Your pain should also improve over the next several days.  If you develop any worsening of your symptoms, please return to the emergency department.

## 2021-10-21 NOTE — ED Triage Notes (Signed)
Pt states that he fell back into a door hitting his right lower back.
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
## 2021-10-26 ENCOUNTER — Telehealth: Payer: Self-pay | Admitting: Internal Medicine

## 2021-10-26 NOTE — Telephone Encounter (Signed)
Pt. [facility]cted [facility] 11.5.2022.  Caller states she is calling for her husband. He went to the ED Monday and Tuesday. He has broken a rib. He has not been able to get out of the bed, he has not used the restroom since Monday. She is worried.   -Caller states husband has a catheter (this is normal for him to self cath QID). Recently broken rib. Wife is unable to assist him out of bed. He has not been able to get out of bed. D/C from hospital since Tuesday. No BM since Monday, Taken stool softener, muscles relaxer, and Oxycodone. Unable to cough. Having Severe pain in rib/back. No CP/SOB.  Advised to go to ED

## 2021-10-29 ENCOUNTER — Encounter: Payer: Self-pay | Admitting: Internal Medicine

## 2021-10-29 ENCOUNTER — Other Ambulatory Visit: Payer: Self-pay

## 2021-10-29 ENCOUNTER — Ambulatory Visit: Payer: Medicare Other | Admitting: Internal Medicine

## 2021-10-29 VITALS — BP 132/68 | HR 90 | Temp 98.1°F | Ht 67.5 in | Wt 195.0 lb

## 2021-10-29 DIAGNOSIS — R7303 Prediabetes: Secondary | ICD-10-CM

## 2021-10-29 DIAGNOSIS — M545 Low back pain, unspecified: Secondary | ICD-10-CM | POA: Diagnosis not present

## 2021-10-29 DIAGNOSIS — S2231XD Fracture of one rib, right side, subsequent encounter for fracture with routine healing: Secondary | ICD-10-CM | POA: Diagnosis not present

## 2021-10-29 DIAGNOSIS — R3129 Other microscopic hematuria: Secondary | ICD-10-CM | POA: Diagnosis not present
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
## 2021-10-29 NOTE — Patient Instructions (Addendum)
Please continue all other medications as before, and refills have been done if requested.  Please have the pharmacy call with any other refills you may need.  Please continue your efforts at being more active, low cholesterol diet, and weight control..  Please keep your appointments with your specialists as you may have planned  Please make an Appointment to return in 3 months, or sooner if needed 

## 2021-11-01 ENCOUNTER — Encounter: Payer: Self-pay | Admitting: Internal Medicine

## 2021-11-01 DIAGNOSIS — S2239XA Fracture of one rib, unspecified side, initial encounter for closed fracture: Secondary | ICD-10-CM | POA: Insufficient documentation

## 2021-11-01 DIAGNOSIS — R3129 Other microscopic hematuria: Secondary | ICD-10-CM | POA: Insufficient documentation

## 2021-11-01 NOTE — Assessment & Plan Note (Signed)
Lab Results  Component Value Date   HGBA1C 6.2 12/11/2020   Stable, pt to continue current medical treatment  - diet

## 2021-11-01 NOTE — Assessment & Plan Note (Signed)
Right 12th floating rib, declines need for pain control, will continue to follow

## 2021-11-01 NOTE — Assessment & Plan Note (Signed)
CT without specific renal abnormal, but suspect renal bruising and blood in urine from this; pt and family plan to also bring this to attention of urology nov 18 at annual visit

## 2021-11-01 NOTE — Assessment & Plan Note (Signed)
Acute on chronic post fall with L1/L2 transvers process fractures; declines further effort at pain control for now, to continue tylenol prn

## 2021-11-02 ENCOUNTER — Encounter: Payer: Self-pay | Admitting: Internal Medicine

## 2021-11-02 ENCOUNTER — Ambulatory Visit: Payer: Medicare Other | Admitting: Internal Medicine

## 2021-11-02 ENCOUNTER — Other Ambulatory Visit: Payer: Self-pay

## 2021-11-02 DIAGNOSIS — H6120 Impacted cerumen, unspecified ear: Secondary | ICD-10-CM | POA: Insufficient documentation

## 2021-11-02 DIAGNOSIS — H6123 Impacted cerumen, bilateral: Secondary | ICD-10-CM | POA: Diagnosis not present

## 2021-11-02 DIAGNOSIS — S2231XD Fracture of one rib, right side, subsequent encounter for fracture with routine healing: Secondary | ICD-10-CM

## 2021-11-02 DIAGNOSIS — H9190 Unspecified hearing loss, unspecified ear: Secondary | ICD-10-CM | POA: Insufficient documentation

## 2021-11-02 DIAGNOSIS — H9193 Unspecified hearing loss, bilateral: Secondary | ICD-10-CM

## 2021-11-02 NOTE — Assessment & Plan Note (Signed)
Not better after ear irrigation Hearing aids appt at 3 pm

## 2021-11-02 NOTE — Progress Notes (Signed)
Subjective:  Patient ID: Daniel Rowland, male    DOB: 1935/01/04  Age: 86 y.o. MRN: [id number]  CC: Cerumen Impaction (Pt states his hearing aid messed up and before they do a new test he need his ear clean out)   HPI Daniel Rowland presents for rib fx C/o hearing loss - worse  Outpatient Medications Prior to Visit  Medication Sig Dispense Refill   acetaminophen (TYLENOL) 500 MG tablet Take 1,000 mg by mouth every 6 (six) hours as needed for mild pain.     aspirin EC 81 MG tablet Take 81 mg by mouth daily.     cholecalciferol (VITAMIN D3) 25 MCG (1000 UNIT) tablet Take 1,000 Units by mouth daily.     lovastatin (MEVACOR) 40 MG tablet TAKE 1 TABLET BY MOUTH EVERY DAY (Patient taking differently: Take 40 mg by mouth daily.) 90 tablet 3   pantoprazole (PROTONIX) 40 MG tablet TAKE 1 TABLET BY MOUTH EVERY DAY (Patient taking differently: Take 40 mg by mouth daily.) 90 tablet 3   No facility-administered medications prior to visit.    ROS: Review of Systems  Constitutional:  Negative for appetite change, fatigue and unexpected weight change.  HENT:  Positive for hearing loss. Negative for congestion, nosebleeds, sneezing, sore throat and trouble swallowing.   Eyes:  Negative for itching and visual disturbance.  Respiratory:  Negative for cough and shortness of breath.   Cardiovascular:  Positive for chest pain. Negative for palpitations and leg swelling.  Gastrointestinal:  Negative for abdominal distention, blood in stool, diarrhea and nausea.  Genitourinary:  Negative for frequency and hematuria.  Musculoskeletal:  Negative for back pain, gait problem, joint swelling and neck pain.  Skin:  Negative for rash.  Neurological:  Negative for dizziness, tremors, speech difficulty and weakness.  Psychiatric/Behavioral:  Negative for agitation, dysphoric mood and sleep disturbance. The patient is not nervous/anxious.    Objective:  BP 130/64 (BP Location: Left Arm)   Temp (!) 83 F (28.3  C) (Oral)   SpO2 97%   BP Readings from Last 3 Encounters:  11/02/21 130/64  10/29/21 132/68  10/21/21 (!) 152/83    Wt Readings from Last 3 Encounters:  10/29/21 195 lb (88.5 kg)  10/21/21 205 lb (93 kg)  12/23/20 205 lb 3.2 oz (93.1 kg)    Physical Exam Constitutional:      General: He is not in acute distress.    Appearance: Normal appearance. He is well-developed.     Comments: NAD  Eyes:     Conjunctiva/sclera: Conjunctivae normal.     Pupils: Pupils are equal, round, and reactive to light.  Neck:     Thyroid: No thyromegaly.     Vascular: No JVD.  Cardiovascular:     Rate and Rhythm: Normal rate and regular rhythm.     Heart sounds: Normal heart sounds. No murmur heard.   No friction rub. No gallop.  Pulmonary:     Effort: Pulmonary effort is normal. No respiratory distress.     Breath sounds: Normal breath sounds. No wheezing or rales.  Chest:     Chest wall: No tenderness.  Abdominal:     General: Bowel sounds are normal. There is no distension.     Palpations: Abdomen is soft. There is no mass.     Tenderness: There is no abdominal tenderness. There is no guarding or rebound.  Musculoskeletal:        General: Tenderness present. Normal range of motion.  Cervical back: Normal range of motion.  Lymphadenopathy:     Cervical: No cervical adenopathy.  Skin:    General: Skin is warm and dry.     Findings: No rash.  Neurological:     Mental Status: He is alert and oriented to person, place, and time.     Cranial Nerves: No cranial nerve deficit.     Motor: No abnormal muscle tone.     Coordination: Coordination normal.     Gait: Gait normal.     Deep Tendon Reflexes: Reflexes are normal and symmetric.  Psychiatric:        Behavior: Behavior normal.        Thought Content: Thought content normal.        Judgment: Judgment normal.   B wax Very hard hearing R ribs w/pain   Procedure Note :     Procedure :  Ear irrigation right and left ears    Indication:  Cerumen impaction right and left ears   Risks, including pain, dizziness, eardrum perforation, bleeding, infection and others as well as benefits were explained to the patient in detail. Verbal consent was obtained and the patient agreed to proceed.    We used "The Elephant Ear Irrigation Device" filled with lukewarm water for irrigation. A large amount wax was recovered from both ears. Procedure has also required manual wax removal/instrumentation with an ear wax curette and ear forceps on the right and left ears.   Tolerated well. Complications: None.   Postprocedure instructions :  Call if problems.  Lab Results  Component Value Date   WBC 7.8 10/21/2021   HGB 15.5 10/21/2021   HCT 46.3 10/21/2021   PLT 267 10/21/2021   GLUCOSE 151 (H) 10/21/2021   CHOL 121 12/11/2020   TRIG 77.0 12/11/2020   HDL 44.60 12/11/2020   LDLCALC 61 12/11/2020   ALT 16 10/21/2021   AST 20 10/21/2021   NA 140 10/21/2021   K 3.8 10/21/2021   CL 109 10/21/2021   CREATININE 1.09 10/21/2021   BUN 13 10/21/2021   CO2 23 10/21/2021   TSH 1.74 12/11/2020   PSA 0.17 11/15/2016   HGBA1C 6.2 12/11/2020    DG Lumbar Spine Complete  Result Date: 10/21/2021 CLINICAL DATA:  86 year old male with history of trauma from a fall complaining of low back pain. EXAM: LUMBAR SPINE - COMPLETE 4+ VIEW COMPARISON:  No priors. FINDINGS: Five views of the lumbar spine demonstrate no definite acute displaced fracture or compression type fracture. There is severe multilevel degenerative disc disease, most pronounced at L3-L4 and L4-L5. Severe multilevel facet arthropathy, most severe at L4-L5 and L5-S1. No defects of the pars interarticularis. Extensive vascular calcifications are noted in the abdominal aorta. IMPRESSION: 1. No acute radiographic abnormality of the lumbar spine. 2. Severe multilevel degenerative disc disease and lumbar spondylosis, as above. 3. Aortic atherosclerosis. Electronically Signed   By:  Vinnie Langton M.D.   On: 10/21/2021 07:34   CT ABDOMEN PELVIS W CONTRAST  Result Date: 10/21/2021 CLINICAL DATA:  Abdominal trauma.  Fall hitting back on chair EXAM: CT ABDOMEN AND PELVIS WITH CONTRAST TECHNIQUE: Multidetector CT imaging of the abdomen and pelvis was performed using the standard protocol following bolus administration of intravenous contrast. CONTRAST:  49mL OMNIPAQUE IOHEXOL 350 MG/ML SOLN COMPARISON:  03/19/2016 FINDINGS: Lower chest: No acute abnormality. Hepatobiliary: No focal liver abnormality is seen. No gallstones, gallbladder wall thickening, or biliary dilatation. Pancreas: Unremarkable. No pancreatic ductal dilatation or surrounding inflammatory changes. Spleen: Normal in  size without focal abnormality. Adrenals/Urinary Tract: Normal adrenal glands. Inferior pole left kidney cyst measures 4.1 cm. No nephrolithiasis or hydronephrosis identified bilaterally. There is diffuse bladder wall thickening and trabeculation which may reflect underlying chronic cystitis and or bladder outlet obstruction. No bladder calculi or focal enhancing lesion noted. Stomach/Bowel: Moderate size hiatal hernia. The appendix is not confidently identified. No bowel wall thickening, inflammation, or distension. Vascular/Lymphatic: Aortic atherosclerosis. No enlarged abdominal or pelvic lymph nodes. Reproductive: Mild prostate gland enlargement. Other: 4.4 x 3.3 cm fat containing umbilical hernia. No free fluid or fluid collections identified within the abdomen or pelvis. Fat containing left inguinal hernia, image 86/2. Musculoskeletal: There is a fracture involving the posterior aspect of the right twelfth rib. Fractures involving the right transverse process of the L1 and L2 vertebral bodies identified. Multi level degenerative disc disease noted within the thoracic and lumbar spine. The lumbar vertebral body heights are maintained. IMPRESSION: 1. Fractures involving the right transverse process of L1 and  L2 vertebral bodies. 2. Right twelfth rib fracture. 3. No acute findings identified within the abdomen or pelvis. 4. Hiatal hernia. 5. Fat containing umbilical and left inguinal hernias. 6. Aortic Atherosclerosis (ICD10-I70.0). Electronically Signed   By: Kerby Moors M.D.   On: 10/21/2021 09:29   CT L-SPINE NO CHARGE  Result Date: 10/21/2021 CLINICAL DATA:  Fall.  Back pain. EXAM: CT LUMBAR SPINE WITHOUT CONTRAST TECHNIQUE: Multidetector CT imaging of the lumbar spine was performed without intravenous contrast administration. Multiplanar CT image reconstructions were also generated. COMPARISON:  Radiography same day.  CT 03/19/2016 FINDINGS: Segmentation: 5 lumbar type vertebral bodies. Alignment: Mild scoliotic curvature convex to the right. Vertebrae: No fracture or focal bone lesion. Chronic discogenic endplate changes. Paraspinal and other soft tissues: Aortic atherosclerosis. Otherwise negative. Disc levels: T11-12: Chronic disc degeneration with loss of disc height. No compressive stenosis. T12-L1: Normal interspace.  Insignificant Schmorl's node. L1-2: Normal interspace.  Insignificant Schmorl's node. L2-3: Bulging of the disc. Mild facet and ligamentous hypertrophy. Mild stenosis of both lateral recesses without definite neural compression. Chronic discogenic endplate changes. L3-4: Endplate osteophytes and bulging of the disc. Facet and ligamentous hypertrophy. Mild multifactorial spinal stenosis that could potentially be significant. Chronic discogenic endplate changes. L4-5: Disc degeneration with loss of disc height. Chronic discogenic endplate changes. Endplate osteophytes more prominent towards the right. Facet and ligamentous hypertrophy. Stenosis of the lateral recesses and foramina, right more than left. L5-S1: Mild bulging of the disc. Mild facet osteoarthritis. No compressive canal or foraminal narrowing. Overall, the findings appear similar to the study of March 2017, with only mild  progressive degenerative worsening. IMPRESSION: No acute or traumatic finding. Chronic degenerative disc disease and degenerative facet disease throughout the region which could certainly relate to back pain. Mild progressive worsening since March of 2017. Spinal stenosis with potential for neural compression at L3-4 and L4-5. Electronically Signed   By: Nelson Chimes M.D.   On: 10/21/2021 09:28    Assessment & Plan:   Problem List Items Addressed This Visit     Cerumen impaction    Will irrigate      Hearing loss    Not better after ear irrigation Hearing aids appt at 3 pm      Rib fracture    Pain is better on the R Using a hernia belt  Off pain meds         No orders of the defined types were placed in this encounter.     Follow-up: Return for  a follow-up visit.  Walker Kehr, MD

## 2021-11-02 NOTE — Assessment & Plan Note (Signed)
Pain is better on the R Using a hernia belt  Off pain meds

## 2021-11-02 NOTE — Assessment & Plan Note (Signed)
Will irrigate 

## 2021-11-22 ENCOUNTER — Other Ambulatory Visit: Payer: Self-pay | Admitting: Internal Medicine

## 2021-11-22 NOTE — Telephone Encounter (Signed)
Please refill as per office routine med refill policy (all routine meds to be refilled for 3 mo or monthly (per pt preference) up to one year from last visit, then month to month grace period for 3 mo, then further med refills will have to be denied) ? ?

## 2021-12-16 ENCOUNTER — Encounter: Payer: Self-pay | Admitting: Adult Health

## 2021-12-16 ENCOUNTER — Other Ambulatory Visit: Payer: Self-pay

## 2021-12-16 ENCOUNTER — Telehealth (INDEPENDENT_AMBULATORY_CARE_PROVIDER_SITE_OTHER): Payer: Medicare Other | Admitting: Adult Health

## 2021-12-16 VITALS — Ht 67.5 in | Wt 200.0 lb

## 2021-12-16 DIAGNOSIS — R066 Hiccough: Secondary | ICD-10-CM | POA: Diagnosis not present

## 2021-12-16 MED ORDER — BACLOFEN 5 MG PO TABS: 5.0000 mg | ORAL_TABLET | Freq: Three times a day (TID) | ORAL | 0 refills | Status: DC

## 2021-12-16 NOTE — Progress Notes (Signed)
Virtual Visit via Telephone Note  I connected with Daniel Rowland on 12/16/21 at  3:30 PM EST by telephone and verified that I am speaking with the correct person using two identifiers.   I discussed the limitations, risks, security and privacy concerns of performing an evaluation and management service by telephone and the availability of in person appointments. I also discussed with the patient that there may be a patient responsible charge related to this service. The patient expressed understanding and agreed to proceed.  Location patient: home Location provider: work or home office Participants present for the call: patient, provider Patient did not have a visit in the prior 7 days to address this/these issue(s).   History of Present Illness: He is a patient of Dr. Cathlean Cower.   He reports that over the last three days he has developed a productive cough and congestion. This seems to be improving. Starting last night he developed to constant hiccups that have gone out through the night into today. The hiccups will let up for about 20 minutes and then return.   Denies fevers, chills, shortness of breath, or wheezing.   This happened to him back in 2013 at which time he had IV chlorpromazine in the ER   At home he has tried drinking water, lemon juice and holding is breath which does nothing.    Observations/Objective: Patient sounds cheerful and well on the phone. I do not appreciate any SOB. Speech and thought processing are grossly intact. Patient reported vitals:  Assessment and Plan: 1. Intractable hiccups - Advised that this medication may make him sleepy.  - Baclofen 5 MG TABS; Take 5 mg by mouth 3 (three) times daily. Until hiccups resolve  Dispense: 30 tablet; Refill: 0    Follow Up Instructions:  DIAGMED@   [id number] 5-10 [id number] 11-20 [id number] 21-30 I did not refer this patient for an OV in the next 24 hours for this/these issue(s).  I discussed the assessment and  treatment plan with the patient. The patient was provided an opportunity to ask questions and all were answered. The patient agreed with the plan and demonstrated an understanding of the instructions.   The patient was advised to call back or seek an in-person evaluation if the symptoms worsen or if the condition fails to improve as anticipated.  I provided 15  minutes of non-face-to-face time during this encounter.   Dorothyann Peng, NP

## 2021-12-28 DIAGNOSIS — Z8546 Personal history of malignant neoplasm of prostate: Secondary | ICD-10-CM | POA: Diagnosis not present

## 2021-12-28 DIAGNOSIS — N400 Enlarged prostate without lower urinary tract symptoms: Secondary | ICD-10-CM | POA: Diagnosis not present

## 2021-12-31 DIAGNOSIS — R339 Retention of urine, unspecified: Secondary | ICD-10-CM | POA: Diagnosis not present

## 2021-12-31 DIAGNOSIS — C61 Malignant neoplasm of prostate: Secondary | ICD-10-CM | POA: Diagnosis not present

## 2022-01-26 DIAGNOSIS — C61 Malignant neoplasm of prostate: Secondary | ICD-10-CM | POA: Diagnosis not present

## 2022-01-26 DIAGNOSIS — R339 Retention of urine, unspecified: Secondary | ICD-10-CM | POA: Diagnosis not present

## 2022-01-26 DIAGNOSIS — Z8546 Personal history of malignant neoplasm of prostate: Secondary | ICD-10-CM | POA: Diagnosis not present

## 2022-02-19 ENCOUNTER — Other Ambulatory Visit: Payer: Self-pay | Admitting: Internal Medicine

## 2022-02-19 NOTE — Telephone Encounter (Signed)
Please refill as per office routine med refill policy (all routine meds to be refilled for 3 mo or monthly (per pt preference) up to one year from last visit, then month to month grace period for 3 mo, then further med refills will have to be denied) ? ?

## 2022-02-26 DIAGNOSIS — Z8249 Family history of ischemic heart disease and other diseases of the circulatory system: Secondary | ICD-10-CM | POA: Diagnosis not present

## 2022-02-26 DIAGNOSIS — Z8673 Personal history of transient ischemic attack (TIA), and cerebral infarction without residual deficits: Secondary | ICD-10-CM | POA: Diagnosis not present

## 2022-02-26 DIAGNOSIS — K219 Gastro-esophageal reflux disease without esophagitis: Secondary | ICD-10-CM | POA: Diagnosis not present

## 2022-02-26 DIAGNOSIS — E785 Hyperlipidemia, unspecified: Secondary | ICD-10-CM | POA: Diagnosis not present

## 2022-02-26 DIAGNOSIS — Z7982 Long term (current) use of aspirin: Secondary | ICD-10-CM | POA: Diagnosis not present

## 2022-02-26 DIAGNOSIS — N529 Male erectile dysfunction, unspecified: Secondary | ICD-10-CM | POA: Diagnosis not present

## 2022-02-26 DIAGNOSIS — Z809 Family history of malignant neoplasm, unspecified: Secondary | ICD-10-CM | POA: Diagnosis not present

## 2022-02-26 DIAGNOSIS — Z8546 Personal history of malignant neoplasm of prostate: Secondary | ICD-10-CM | POA: Diagnosis not present

## 2022-02-26 DIAGNOSIS — Z833 Family history of diabetes mellitus: Secondary | ICD-10-CM | POA: Diagnosis not present

## 2022-02-26 DIAGNOSIS — R030 Elevated blood-pressure reading, without diagnosis of hypertension: Secondary | ICD-10-CM | POA: Diagnosis not present

## 2022-02-26 DIAGNOSIS — Z87891 Personal history of nicotine dependence: Secondary | ICD-10-CM | POA: Diagnosis not present

## 2022-02-26 DIAGNOSIS — Z008 Encounter for other general examination: Secondary | ICD-10-CM | POA: Diagnosis not present

## 2022-03-15 DIAGNOSIS — R339 Retention of urine, unspecified: Secondary | ICD-10-CM | POA: Diagnosis not present

## 2022-03-15 DIAGNOSIS — C61 Malignant neoplasm of prostate: Secondary | ICD-10-CM | POA: Diagnosis not present

## 2022-03-15 DIAGNOSIS — Z8546 Personal history of malignant neoplasm of prostate: Secondary | ICD-10-CM | POA: Diagnosis not present

## 2022-03-20 ENCOUNTER — Emergency Department (HOSPITAL_BASED_OUTPATIENT_CLINIC_OR_DEPARTMENT_OTHER): Payer: Medicare HMO

## 2022-03-20 ENCOUNTER — Emergency Department (HOSPITAL_BASED_OUTPATIENT_CLINIC_OR_DEPARTMENT_OTHER)
Admission: EM | Admit: 2022-03-20 | Discharge: 2022-03-20 | Disposition: A | Discharge status: Home / Self Care | Payer: Medicare HMO | Attending: Emergency Medicine | Admitting: Emergency Medicine

## 2022-03-20 ENCOUNTER — Other Ambulatory Visit: Payer: Self-pay

## 2022-03-20 DIAGNOSIS — W010XXA Fall on same level from slipping, tripping and stumbling without subsequent striking against object, initial encounter: Secondary | ICD-10-CM | POA: Diagnosis not present

## 2022-03-20 DIAGNOSIS — S299XXA Unspecified injury of thorax, initial encounter: Secondary | ICD-10-CM | POA: Diagnosis not present

## 2022-03-20 DIAGNOSIS — Y92009 Unspecified place in unspecified non-institutional (private) residence as the place of occurrence of the external cause: Secondary | ICD-10-CM | POA: Diagnosis not present

## 2022-03-20 DIAGNOSIS — J9811 Atelectasis: Secondary | ICD-10-CM | POA: Diagnosis not present

## 2022-03-20 DIAGNOSIS — Z7982 Long term (current) use of aspirin: Secondary | ICD-10-CM | POA: Diagnosis not present

## 2022-03-20 DIAGNOSIS — K449 Diaphragmatic hernia without obstruction or gangrene: Secondary | ICD-10-CM | POA: Diagnosis not present

## 2022-03-20 DIAGNOSIS — S20212A Contusion of left front wall of thorax, initial encounter: Secondary | ICD-10-CM | POA: Insufficient documentation

## 2022-03-20 DIAGNOSIS — I10 Essential (primary) hypertension: Secondary | ICD-10-CM | POA: Insufficient documentation

## 2022-03-20 DIAGNOSIS — R059 Cough, unspecified: Secondary | ICD-10-CM | POA: Diagnosis not present

## 2022-03-20 MED ORDER — ACETAMINOPHEN-CODEINE #3 300-30 MG PO TABS: 1.0000 | ORAL_TABLET | Freq: Three times a day (TID) | ORAL | 0 refills | Status: DC | PRN

## 2022-03-20 NOTE — ED Triage Notes (Signed)
Pt arrives pov, to triage in wheelchair, c/o mechanical fall x 6 days pta. Slipped on wet floor, reports left shoulder pain. Pt denies loc, denies neck pain, denies thinners. Took tylenol 1g x 2 hrs pta ?

## 2022-03-20 NOTE — ED Provider Notes (Signed)
?[facility] ?Provider Note ? ? ?CSN: [id number] ?Arrival date & time: 03/20/22  1147 ? ?  ? ?History ? ?Chief Complaint  ?Patient presents with  ? Fall  ? ? ?Daniel Rowland is a 87 y.o. male. ? ?HPI ? ?  ? ?87 year old male comes in with chief complaint of fall. ?Patient has history of TIA, GERD, hypertension and hyperlipidemia.  He had a fall resulting in rib fracture in November.  He comes in after having a fall 5 days ago at his home.  He reports that he slipped and fell onto wooden surface onto his left side.  Initially he was fine, but as the time has progressed, he is having increased pain on the left side of his chest and his back.  His shoulder pain is improved, but the chest pain has persisted.  He has a cough, which makes the pain unbearable.  ? ?Patient denies any fevers, chills.  Cough is chronic and secondary to his GERD. ? ?Home Medications ?Prior to Admission medications   ?Medication Sig Start Date End Date Taking? Authorizing Provider  ?acetaminophen-codeine (TYLENOL #3) 300-30 MG tablet Take 1 tablet by mouth every 8 (eight) hours as needed for severe pain (pain and cough). 03/20/22  Yes Varney Biles, MD  ?acetaminophen (TYLENOL) 500 MG tablet Take 1,000 mg by mouth every 6 (six) hours as needed for mild pain. ?Patient not taking: Reported on 12/16/2021    [provider]  ?aspirin EC 81 MG tablet Take 81 mg by mouth daily.    [provider]  ?Baclofen 5 MG TABS Take 5 mg by mouth 3 (three) times daily. Until hiccups resolve 12/16/21   Dorothyann Peng, NP  ?cholecalciferol (VITAMIN D3) 25 MCG (1000 UNIT) tablet Take 1,000 Units by mouth daily.    [provider]  ?lovastatin (MEVACOR) 40 MG tablet TAKE 1 TABLET BY MOUTH EVERY DAY 11/23/21   Biagio Borg, MD  ?pantoprazole (PROTONIX) 40 MG tablet TAKE 1 TABLET BY MOUTH EVERY DAY 02/19/22   Biagio Borg, MD  ?   ? ?Allergies    ?Sulfa antibiotics and Nitrofuran derivatives   ? ?Review of Systems    ?Review of Systems  ?All other systems reviewed and are negative. ? ?Physical Exam ?Updated Vital Signs ?BP (!) 161/76 (BP Location: Right Arm)   Pulse 81   Temp 97.9 ?F (36.6 ?C) (Oral)   Resp 18   Ht '5\' 10"'$  (1.778 m)   Wt 90.7 kg   SpO2 98%   BMI 28.70 kg/m?  ?Physical Exam ?Vitals and nursing note reviewed.  ?Constitutional:   ?   Appearance: He is well-developed.  ?HENT:  ?   Head: Atraumatic.  ?Cardiovascular:  ?   Rate and Rhythm: Normal rate.  ?Pulmonary:  ?   Effort: Pulmonary effort is normal.  ?Musculoskeletal:  ?   Cervical back: Neck supple.  ?   Comments: Patient has chest wall tenderness around rib 5 laterally and also around the scapular region on the left side. ?No crepitus  ?Skin: ?   General: Skin is warm.  ?Neurological:  ?   Mental Status: He is alert and oriented to person, place, and time.  ? ? ?ED Results / Procedures / Treatments   ?Labs ?(all labs ordered are listed, but only abnormal results are displayed) ?Labs Reviewed - No data to display ? ?EKG ?None ? ?Radiology ?DG Ribs Unilateral W/Chest Left ? ?Result Date: 03/20/2022 ?CLINICAL DATA:  Left rib pain.  Fall 6 days ago. EXAM: LEFT RIBS AND CHEST - 3+ VIEW COMPARISON:  Chest x-ray December 16, 2016 FINDINGS: Small hiatal hernia. Atelectasis in the left base. No rib fractures identified. No pneumothorax. No other acute abnormalities. IMPRESSION: No rib fractures noted. No pneumothorax. Mild atelectasis in the left base. Electronically Signed   By: Dorise Bullion III M.D.   On: 03/20/2022 12:59   ? ?Procedures ?Procedures  ? ? ?Medications Ordered in ED ?Medications - No data to display ? ?ED Course/ Medical Decision Making/ A&P ?  ?                        ?Medical Decision Making ?Risk ?Prescription drug management. ? ? ? ?This patient presents to the ED with chief complaint(s) of fall with resultant left-sided chest and back pain with pertinent past medical history of rib fractures few months back no anticoagulation use, no  severe headache, neck pain or shortness of breath. ? ?The differential diagnosis includes rib fracture, pneumothorax, chest wall contusion ? ?The initial plan is to x-ray of the ribs, left side. ? ? ?Additional history obtained: ?Additional history obtained from spouse ? ? ?Independent visualization of imaging: ?- Imaging that was independently visualized with scope of interpretation limited to determining acute life threatening conditions related to emergency care: X-ray of the chest, which revealed no evidence of pneumothorax, no clear evidence of left-sided rib fracture ? ?Treatment and Reassessment: Patient does not want anything for pain right now. ?He has leftover oxycodone and muscle relaxants from his rib fracture. ?Plan is for him to take Tylenol and low-dose ibuprofen for the next 3 to 5 days.  He will take Tylenol with codeine in between for pain and cough.  He will take as needed muscle relaxants.  Warm and ice compresses also recommended.  ? ?Based on exam, there is no indication for CT head, C-spine they have been cleared clinically. ? ? ?Final Clinical Impression(s) / ED Diagnoses ?Final diagnoses:  ?Contusion of rib on left side, initial encounter  ? ? ?Rx / DC Orders ?ED Discharge Orders   ? ?      Ordered  ?  acetaminophen-codeine (TYLENOL #3) 300-30 MG tablet  Every 8 hours PRN       ? 03/20/22 1421  ? ?  ?  ? ?  ? ? ?  ?Varney Biles, MD ?03/20/22 1511 ? ?

## 2022-03-20 NOTE — Discharge Instructions (Signed)
The x-ray of your chest is negative for any fracture or collapse of the lung. ? ?It is possible that you have a small fracture that is missed on the x-ray or the pain is because of significant bruising to your muscles or the ribs. ? ?The treatment either way is going to be supportive.  We recommend that you take Tylenol 500 mg every 6 hours for pain for the next 7 days, ibuprofen 400 mg every 8 hours for the next 3 days. ? ?We are giving you a prescription for Tylenol with codeine.  This medicine will help you both with cough and pain -and can be taken in addition to the ibuprofen and Tylenol mentioned above. ? ?You can take the muscle relaxant you have at home only if it is helping. ? ?Ice and warm compresses to the area of pain should also help. ? ?If you have strong pain medicine like oxycodone at home, only take that if your pain is excruciating despite taking the medications mentioned above. ? ?Make sure you are using incentive spirometer several times in a day to prevent pneumonia. ?

## 2022-04-05 ENCOUNTER — Encounter: Payer: Self-pay | Admitting: Internal Medicine

## 2022-04-05 ENCOUNTER — Ambulatory Visit (INDEPENDENT_AMBULATORY_CARE_PROVIDER_SITE_OTHER): Payer: Medicare HMO | Admitting: Internal Medicine

## 2022-04-05 VITALS — BP 148/70 | HR 73 | Temp 98.1°F | Ht 70.0 in | Wt 193.0 lb

## 2022-04-05 DIAGNOSIS — E538 Deficiency of other specified B group vitamins: Secondary | ICD-10-CM | POA: Diagnosis not present

## 2022-04-05 DIAGNOSIS — E7849 Other hyperlipidemia: Secondary | ICD-10-CM | POA: Diagnosis not present

## 2022-04-05 DIAGNOSIS — H9193 Unspecified hearing loss, bilateral: Secondary | ICD-10-CM | POA: Diagnosis not present

## 2022-04-05 DIAGNOSIS — R030 Elevated blood-pressure reading, without diagnosis of hypertension: Secondary | ICD-10-CM | POA: Diagnosis not present

## 2022-04-05 DIAGNOSIS — R7303 Prediabetes: Secondary | ICD-10-CM | POA: Diagnosis not present

## 2022-04-05 DIAGNOSIS — C61 Malignant neoplasm of prostate: Secondary | ICD-10-CM | POA: Diagnosis not present

## 2022-04-05 DIAGNOSIS — E559 Vitamin D deficiency, unspecified: Secondary | ICD-10-CM

## 2022-04-05 DIAGNOSIS — M25512 Pain in left shoulder: Secondary | ICD-10-CM

## 2022-04-05 DIAGNOSIS — Z0001 Encounter for general adult medical examination with abnormal findings: Secondary | ICD-10-CM | POA: Diagnosis not present

## 2022-04-05 LAB — BASIC METABOLIC PANEL
BUN: 16 mg/dL (ref 6–23)
CO2: 28 mEq/L (ref 19–32)
Calcium: 9.1 mg/dL (ref 8.4–10.5)
Chloride: 108 mEq/L (ref 96–112)
Creatinine, Ser: 1.1 mg/dL (ref 0.40–1.50)
GFR: 61.72 mL/min (ref >60.00)
Glucose, Bld: 72 mg/dL (ref 70–99)
Potassium: 4.5 mEq/L (ref 3.5–5.1)
Sodium: 142 mEq/L (ref 135–145)

## 2022-04-05 LAB — PSA: PSA: 0.1 ng/mL (ref 0.10–4.00)

## 2022-04-05 LAB — TSH: TSH: 1.86 u[IU]/mL (ref 0.35–5.50)

## 2022-04-05 LAB — CBC WITH DIFFERENTIAL/PLATELET
Basophils Absolute: 0 10*3/uL (ref 0.0–0.1)
Basophils Relative: 0.6 % (ref 0.0–3.0)
Eosinophils Absolute: 0.1 10*3/uL (ref 0.0–0.7)
Eosinophils Relative: 2.3 % (ref 0.0–5.0)
HCT: 42.5 % (ref 39.0–52.0)
Hemoglobin: 14.3 g/dL (ref 13.0–17.0)
Lymphocytes Relative: 19.1 % (ref 12.0–46.0)
Lymphs Abs: 1.1 10*3/uL (ref 0.7–4.0)
MCHC: 33.8 g/dL (ref 30.0–36.0)
MCV: 93.1 fl (ref 78.0–100.0)
Monocytes Absolute: 0.8 10*3/uL (ref 0.1–1.0)
Monocytes Relative: 13.2 % — ABNORMAL HIGH (ref 3.0–12.0)
Neutro Abs: 3.7 10*3/uL (ref 1.4–7.7)
Neutrophils Relative %: 64.8 % (ref 43.0–77.0)
Platelets: 284 10*3/uL (ref 150.0–400.0)
RBC: 4.56 Mil/uL (ref 4.22–5.81)
RDW: 14.1 % (ref 11.5–15.5)
WBC: 5.8 10*3/uL (ref 4.0–10.5)

## 2022-04-05 LAB — LIPID PANEL
Cholesterol: 128 mg/dL (ref 0–200)
HDL: 48.6 mg/dL (ref >39.00)
LDL Cholesterol: 63 mg/dL (ref 0–99)
NonHDL: 78.97
Total CHOL/HDL Ratio: 3
Triglycerides: 80 mg/dL (ref 0.0–149.0)
VLDL: 16 mg/dL (ref 0.0–40.0)

## 2022-04-05 LAB — URINALYSIS, ROUTINE W REFLEX MICROSCOPIC
Bilirubin Urine: NEGATIVE
Hgb urine dipstick: NEGATIVE
Ketones, ur: NEGATIVE
Nitrite: POSITIVE — AB
RBC / HPF: NONE SEEN (ref 0–?)
Specific Gravity, Urine: 1.01 (ref 1.000–1.030)
Total Protein, Urine: NEGATIVE
Urine Glucose: NEGATIVE
Urobilinogen, UA: 0.2 (ref 0.0–1.0)
pH: 6 (ref 5.0–8.0)

## 2022-04-05 LAB — HEPATIC FUNCTION PANEL
ALT: 20 U/L (ref 0–53)
AST: 19 U/L (ref 0–37)
Albumin: 4.1 g/dL (ref 3.5–5.2)
Alkaline Phosphatase: 172 U/L — ABNORMAL HIGH (ref 39–117)
Bilirubin, Direct: 0.1 mg/dL (ref 0.0–0.3)
Total Bilirubin: 0.4 mg/dL (ref 0.2–1.2)
Total Protein: 6.7 g/dL (ref 6.0–8.3)

## 2022-04-05 LAB — VITAMIN D 25 HYDROXY (VIT D DEFICIENCY, FRACTURES): VITD: 39.07 ng/mL (ref 30.00–100.00)

## 2022-04-05 LAB — HEMOGLOBIN A1C: Hgb A1c MFr Bld: 6.5 % (ref 4.6–6.5)

## 2022-04-05 LAB — VITAMIN B12: Vitamin B-12: 331 pg/mL (ref 211–911)

## 2022-04-05 NOTE — Patient Instructions (Signed)
Please continue all other medications as before, and refills have been done if requested. ? ?Please have the pharmacy call with any other refills you may need. ? ?Please continue your efforts at being more active, low cholesterol diet, and weight control. ? ?You are otherwise up to date with prevention measures today. ? ?Please keep your appointments with your specialists as you may have planned ? ?Please consider follow up exam for the left shoulder and chest with Sports medicine in another 2 weeks if the pain persists ? ?Please go to the LAB at the blood drawing area for the tests to be done ? ?You will be contacted by phone if any changes need to be made immediately.  Otherwise, you will receive a letter about your results with an explanation, but please check with MyChart first. ? ?Please remember to sign up for MyChart if you have not done so, as this will be important to you in the future with finding out test results, communicating by private email, and scheduling acute appointments online when needed. ? ?Please make an Appointment to return in 6 months, or sooner if needed ? ?

## 2022-04-05 NOTE — Assessment & Plan Note (Signed)
Chronic, better with new hearing aids,  to f/u any worsening symptoms or concerns ?

## 2022-04-05 NOTE — Assessment & Plan Note (Signed)
Last vitamin D ?Lab Results  ?Component Value Date  ? VD25OH 49.47 12/11/2020  ? ?Stable, cont oral replacement ? ?

## 2022-04-05 NOTE — Assessment & Plan Note (Signed)
Lab Results  ?Component Value Date  ? HGBA1C 6.2 12/11/2020  ? ?Stable, pt to continue current medical treatment  - diet ? ?
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
## 2022-04-10 ENCOUNTER — Encounter: Payer: Self-pay | Admitting: Internal Medicine

## 2022-04-10 NOTE — Assessment & Plan Note (Signed)
Lab Results  ?Component Value Date  ? [place] 63 04/05/2022  ? ?Stable, pt to continue current statin lovastatin ? ?

## 2022-04-10 NOTE — Assessment & Plan Note (Signed)
X 4 wks with good function and overall improving pain, advised tylenol prn and f/u with sport med if not improved in another 2 wks ?

## 2022-04-10 NOTE — Assessment & Plan Note (Signed)
Also for f/u psa per pt reqeust, asympt ?

## 2022-04-10 NOTE — Assessment & Plan Note (Signed)
BP Readings from Last 3 Encounters:  ?04/05/22 (!) 148/70  ?03/20/22 (!) 161/76  ?11/02/21 130/64  ? ?Mild uncontrolled, d/w pt, declines med tx for now, for low salt diet, wt control ?

## 2022-04-10 NOTE — Assessment & Plan Note (Signed)
Age and sex appropriate education and counseling updated with regular exercise and diet ?Referrals for preventative services - none needed ?Immunizations addressed - declines covid boostser ?Smoking counseling  - none needed ?Evidence for depression or other mood disorder - none significant ?Most recent labs reviewed. ?I have personally reviewed and have noted: ?1) the patient's medical and social history ?2) The patient's current medications and supplements ?3) The patient's height, weight, and BMI have been recorded in the chart ? ?

## 2022-04-16 DIAGNOSIS — Z8546 Personal history of malignant neoplasm of prostate: Secondary | ICD-10-CM | POA: Diagnosis not present

## 2022-04-16 DIAGNOSIS — C61 Malignant neoplasm of prostate: Secondary | ICD-10-CM | POA: Diagnosis not present

## 2022-04-16 DIAGNOSIS — R339 Retention of urine, unspecified: Secondary | ICD-10-CM | POA: Diagnosis not present

## 2022-05-19 DIAGNOSIS — Z0100 Encounter for examination of eyes and vision without abnormal findings: Secondary | ICD-10-CM | POA: Diagnosis not present

## 2022-05-19 DIAGNOSIS — H524 Presbyopia: Secondary | ICD-10-CM | POA: Diagnosis not present

## 2022-06-02 DIAGNOSIS — C61 Malignant neoplasm of prostate: Secondary | ICD-10-CM | POA: Diagnosis not present

## 2022-06-02 DIAGNOSIS — R339 Retention of urine, unspecified: Secondary | ICD-10-CM | POA: Diagnosis not present

## 2022-06-02 DIAGNOSIS — Z8546 Personal history of malignant neoplasm of prostate: Secondary | ICD-10-CM | POA: Diagnosis not present

## 2022-07-02 DIAGNOSIS — C61 Malignant neoplasm of prostate: Secondary | ICD-10-CM | POA: Diagnosis not present

## 2022-07-02 DIAGNOSIS — Z8546 Personal history of malignant neoplasm of prostate: Secondary | ICD-10-CM | POA: Diagnosis not present

## 2022-07-02 DIAGNOSIS — R339 Retention of urine, unspecified: Secondary | ICD-10-CM | POA: Diagnosis not present

## 2022-07-30 DIAGNOSIS — R339 Retention of urine, unspecified: Secondary | ICD-10-CM | POA: Diagnosis not present

## 2022-07-30 DIAGNOSIS — C61 Malignant neoplasm of prostate: Secondary | ICD-10-CM | POA: Diagnosis not present

## 2022-08-27 ENCOUNTER — Encounter: Payer: Self-pay | Admitting: Internal Medicine

## 2022-08-31 DIAGNOSIS — C61 Malignant neoplasm of prostate: Secondary | ICD-10-CM | POA: Diagnosis not present

## 2022-08-31 DIAGNOSIS — R339 Retention of urine, unspecified: Secondary | ICD-10-CM | POA: Diagnosis not present

## 2022-09-28 DIAGNOSIS — Z8546 Personal history of malignant neoplasm of prostate: Secondary | ICD-10-CM | POA: Diagnosis not present

## 2022-09-28 DIAGNOSIS — R339 Retention of urine, unspecified: Secondary | ICD-10-CM | POA: Diagnosis not present

## 2022-09-28 DIAGNOSIS — C61 Malignant neoplasm of prostate: Secondary | ICD-10-CM | POA: Diagnosis not present

## 2022-10-04 ENCOUNTER — Ambulatory Visit (INDEPENDENT_AMBULATORY_CARE_PROVIDER_SITE_OTHER): Payer: Medicare HMO

## 2022-10-04 VITALS — Ht 70.0 in | Wt 195.0 lb

## 2022-10-04 DIAGNOSIS — Z0000 Encounter for general adult medical examination without abnormal findings: Secondary | ICD-10-CM
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
## 2022-10-06 ENCOUNTER — Ambulatory Visit (INDEPENDENT_AMBULATORY_CARE_PROVIDER_SITE_OTHER): Payer: Medicare HMO | Admitting: Internal Medicine

## 2022-10-06 ENCOUNTER — Encounter: Payer: Self-pay | Admitting: Internal Medicine

## 2022-10-06 VITALS — BP 126/64 | HR 67 | Temp 98.0°F | Ht 70.0 in | Wt 198.0 lb

## 2022-10-06 DIAGNOSIS — E559 Vitamin D deficiency, unspecified: Secondary | ICD-10-CM

## 2022-10-06 DIAGNOSIS — R7303 Prediabetes: Secondary | ICD-10-CM | POA: Diagnosis not present

## 2022-10-06 DIAGNOSIS — Z23 Encounter for immunization: Secondary | ICD-10-CM

## 2022-10-06 DIAGNOSIS — R030 Elevated blood-pressure reading, without diagnosis of hypertension: Secondary | ICD-10-CM | POA: Diagnosis not present

## 2022-10-06 DIAGNOSIS — E7849 Other hyperlipidemia: Secondary | ICD-10-CM | POA: Diagnosis not present

## 2022-10-06 DIAGNOSIS — R9431 Abnormal electrocardiogram [ECG] [EKG]: Secondary | ICD-10-CM | POA: Diagnosis not present

## 2022-10-06 DIAGNOSIS — J309 Allergic rhinitis, unspecified: Secondary | ICD-10-CM | POA: Diagnosis not present

## 2022-10-06 DIAGNOSIS — K429 Umbilical hernia without obstruction or gangrene: Secondary | ICD-10-CM

## 2022-10-06 LAB — LIPID PANEL
Cholesterol: 126 mg/dL (ref 0–200)
HDL: 45.4 mg/dL (ref >39.00)
LDL Cholesterol: 64 mg/dL (ref 0–99)
NonHDL: 80.23
Total CHOL/HDL Ratio: 3
Triglycerides: 79 mg/dL (ref 0.0–149.0)
VLDL: 15.8 mg/dL (ref 0.0–40.0)

## 2022-10-06 LAB — HEMOGLOBIN A1C: Hgb A1c MFr Bld: 6.5 % (ref 4.6–6.5)

## 2022-10-06 LAB — HEPATIC FUNCTION PANEL
ALT: 15 U/L (ref 0–53)
AST: 17 U/L (ref 0–37)
Albumin: 4.1 g/dL (ref 3.5–5.2)
Alkaline Phosphatase: 102 U/L (ref 39–117)
Bilirubin, Direct: 0.1 mg/dL (ref 0.0–0.3)
Total Bilirubin: 0.6 mg/dL (ref 0.2–1.2)
Total Protein: 7 g/dL (ref 6.0–8.3)

## 2022-10-06 LAB — BASIC METABOLIC PANEL
BUN: 13 mg/dL (ref 6–23)
CO2: 28 mEq/L (ref 19–32)
Calcium: 9.2 mg/dL (ref 8.4–10.5)
Chloride: 106 mEq/L (ref 96–112)
Creatinine, Ser: 1 mg/dL (ref 0.40–1.50)
GFR: 68.95 mL/min (ref >60.00)
Glucose, Bld: 101 mg/dL — ABNORMAL HIGH (ref 70–99)
Potassium: 4 mEq/L (ref 3.5–5.1)
Sodium: 140 mEq/L (ref 135–145)

## 2022-10-06 MED ORDER — TRIAMCINOLONE ACETONIDE 55 MCG/ACT NA AERO: 2.0000 | INHALATION_SPRAY | Freq: Every day | NASAL | 12 refills | Status: DC
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
## 2022-10-06 NOTE — Patient Instructions (Addendum)
You had the flu shot today  Ok to take the OTC Nasacort for the allergies   Please continue all other medications as before, and refills have been done if requested.  Please have the pharmacy call with any other refills you may need.  Please continue your efforts at being more active, low cholesterol diet, and weight control.  Please keep your appointments with your specialists as you may have planned  You will be contacted regarding the referral for: Cardiac CT score, and General Surgury  Please go to the LAB at the blood drawing area for the tests to be done  You will be contacted by phone if any changes need to be made immediately.  Otherwise, you will receive a letter about your results with an explanation, but please check with MyChart first.  Please remember to sign up for MyChart if you have not done so, as this will be important to you in the future with finding out test results, communicating by private email, and scheduling acute appointments online when needed.  Please make an Appointment to return in 6 months, or sooner if needed

## 2022-10-09 DIAGNOSIS — J309 Allergic rhinitis, unspecified: Secondary | ICD-10-CM | POA: Insufficient documentation

## 2022-10-09 NOTE — Assessment & Plan Note (Signed)
Mild to mod, for add nasacort asd,  to f/u any worsening symptoms or concerns

## 2022-10-09 NOTE — Assessment & Plan Note (Signed)
Lab Results  Component Value Date   HGBA1C 6.5 10/06/2022   Stable, pt to continue current medical treatment  - diet, wt control, excercise

## 2022-10-09 NOTE — Assessment & Plan Note (Signed)
Now with increased mild symptomatic, for general surgury referral

## 2022-10-09 NOTE — Assessment & Plan Note (Signed)
BP Readings from Last 3 Encounters:  10/06/22 126/64  04/05/22 (!) 148/70  03/20/22 (!) 161/76   Stable, pt to continue medical treatment  - diet, wt control, low salt

## 2022-10-09 NOTE — Assessment & Plan Note (Signed)
Last vitamin D Lab Results  Component Value Date   VD25OH 39.07 04/05/2022   Low, to start oral replacement

## 2022-10-09 NOTE — Assessment & Plan Note (Addendum)
Lab Results  Component Value Date   LDLCALC 64 10/06/2022   Stable, pt to continue current statin lovsatatin 40 mg qd, also for cardiac CT score

## 2022-10-12 NOTE — Patient Instructions (Signed)
Daniel Rowland , Thank you for taking time to come for your Medicare Wellness Visit. I appreciate your ongoing commitment to your health goals. Please review the following plan we discussed and let me know if I can assist you in the future.   These are the goals we discussed:  Goals      DIET - INCREASE WATER INTAKE        This is a list of the screening recommended for you and due dates:  Health Maintenance  Topic Date Due   Medicare Annual Wellness Visit  Never done   COVID-19 Vaccine (5 - Pfizer risk series) 10/22/2022*   Tetanus Vaccine  12/10/2029   Pneumonia Vaccine  Completed   Flu Shot  Completed   Zoster (Shingles) Vaccine  Completed   HPV Vaccine  Aged Out  *Topic was postponed. The date shown is not the original due date.    Advanced directives: Please bring a copy of your health care power of attorney and living will to the office to be added to your chart at your convenience.   Conditions/risks identified: Aim for 30 minutes of exercise or brisk walking, 6-8 glasses of water, and 5 servings of fruits and vegetables each day.   Next appointment: Follow up in one year for your annual wellness visit.   Preventive Care 24 Years and Older, Male  Preventive care refers to lifestyle choices and visits with your health care provider that can promote health and wellness. What does preventive care include? A yearly physical exam. This is also called an annual well check. Dental exams once or twice a year. Routine eye exams. Ask your health care provider how often you should have your eyes checked. Personal lifestyle choices, including: Daily care of your teeth and gums. Regular physical activity. Eating a healthy diet. Avoiding tobacco and drug use. Limiting alcohol use. Practicing safe sex. Taking low doses of aspirin every day. Taking vitamin and mineral supplements as recommended by your health care provider. What happens during an annual well check? The services and  screenings done by your health care provider during your annual well check will depend on your age, overall health, lifestyle risk factors, and family history of disease. Counseling  Your health care provider may ask you questions about your: Alcohol use. Tobacco use. Drug use. Emotional well-being. Home and relationship well-being. Sexual activity. Eating habits. History of falls. Memory and ability to understand (cognition). Work and work Statistician. Screening  You may have the following tests or measurements: Height, weight, and BMI. Blood pressure. Lipid and cholesterol levels. These may be checked every 5 years, or more frequently if you are over 86 years old. Skin check. Lung cancer screening. You may have this screening every year starting at age 90 if you have a 30-pack-year history of smoking and currently smoke or have quit within the past 15 years. Fecal occult blood test (FOBT) of the stool. You may have this test every year starting at age 59. Flexible sigmoidoscopy or colonoscopy. You may have a sigmoidoscopy every 5 years or a colonoscopy every 10 years starting at age 109. Prostate cancer screening. Recommendations will vary depending on your family history and other risks. Hepatitis C blood test. Hepatitis B blood test. Sexually transmitted disease (STD) testing. Diabetes screening. This is done by checking your blood sugar (glucose) after you have not eaten for a while (fasting). You may have this done every 1-3 years. Abdominal aortic aneurysm (AAA) screening. You may need this if you  are a current or former smoker. Osteoporosis. You [place] be screened starting at age 48 if you are at high risk. Talk with your health care provider about your test results, treatment options, and if necessary, the need for more tests. Vaccines  Your health care provider [place] recommend certain vaccines, such as: Influenza vaccine. This is recommended every year. Tetanus, diphtheria, and  acellular pertussis (Tdap, Td) vaccine. You Ross need a Td booster every 10 years. Zoster vaccine. You Maxim need this after age 64. Pneumococcal 13-valent conjugate (PCV13) vaccine. One dose is recommended after age 38. Pneumococcal polysaccharide (PPSV23) vaccine. One dose is recommended after age 80. Talk to your health care provider about which screenings and vaccines you need and how often you need them. This information is not intended to replace advice given to you by your health care provider. Make sure you discuss any questions you have with your health care provider. Document Released: 01/02/2016 Document Revised: 08/25/2016 Document Reviewed: 10/07/2015 Elsevier Interactive Patient Education  2017 [place] Prevention in the Home Falls can cause injuries. They can happen to people of all ages. There are many things you can do to make your home safe and to help prevent falls. What can I do on the outside of my home? Regularly fix the edges of walkways and driveways and fix any cracks. Remove anything that might make you trip as you walk through a door, such as a raised step or threshold. Trim any bushes or trees on the path to your home. Use bright outdoor lighting. Clear any walking paths of anything that might make someone trip, such as rocks or tools. Regularly check to see if handrails are loose or broken. Make sure that both sides of any steps have handrails. Any raised decks and porches should have guardrails on the edges. Have any leaves, snow, or ice cleared regularly. Use sand or salt on walking paths during winter. Clean up any spills in your garage right away. This includes oil or grease spills. What can I do in the bathroom? Use night lights. Install grab bars by the toilet and in the tub and shower. Do not use towel bars as grab bars. Use non-skid mats or decals in the tub or shower. If you need to sit down in the shower, use a plastic, non-slip stool. Keep  the floor dry. Clean up any water that spills on the floor as soon as it happens. Remove soap buildup in the tub or shower regularly. Attach bath mats securely with double-sided non-slip rug tape. Do not have throw rugs and other things on the floor that can make you trip. What can I do in the bedroom? Use night lights. Make sure that you have a light by your bed that is easy to reach. Do not use any sheets or blankets that are too big for your bed. They should not hang down onto the floor. Have a firm chair that has side arms. You can use this for support while you get dressed. Do not have throw rugs and other things on the floor that can make you trip. What can I do in the kitchen? Clean up any spills right away. Avoid walking on wet floors. Keep items that you use a lot in easy-to-reach places. If you need to reach something above you, use a strong step stool that has a grab bar. Keep electrical cords out of the way. Do not use floor polish or wax that makes floors slippery. If you  must use wax, use non-skid floor wax. Do not have throw rugs and other things on the floor that can make you trip. What can I do with my stairs? Do not leave any items on the stairs. Make sure that there are handrails on both sides of the stairs and use them. Fix handrails that are broken or loose. Make sure that handrails are as long as the stairways. Check any carpeting to make sure that it is firmly attached to the stairs. Fix any carpet that is loose or worn. Avoid having throw rugs at the top or bottom of the stairs. If you do have throw rugs, attach them to the floor with carpet tape. Make sure that you have a light switch at the top of the stairs and the bottom of the stairs. If you do not have them, ask someone to add them for you. What else can I do to help prevent falls? Wear shoes that: Do not have high heels. Have rubber bottoms. Are comfortable and fit you well. Are closed at the toe. Do not  wear sandals. If you use a stepladder: Make sure that it is fully opened. Do not climb a closed stepladder. Make sure that both sides of the stepladder are locked into place. Ask someone to hold it for you, if possible. Clearly mark and make sure that you can see: Any grab bars or handrails. First and last steps. Where the edge of each step is. Use tools that help you move around (mobility aids) if they are needed. These include: Canes. Walkers. Scooters. Crutches. Turn on the lights when you go into a dark area. Replace any light bulbs as soon as they burn out. Set up your furniture so you have a clear path. Avoid moving your furniture around. If any of your floors are uneven, fix them. If there are any pets around you, be aware of where they are. Review your medicines with your doctor. Some medicines can make you feel dizzy. This can increase your chance of falling. Ask your doctor what other things that you can do to help prevent falls. This information is not intended to replace advice given to you by your health care provider. Make sure you discuss any questions you have with your health care provider. Document Released: 10/02/2009 Document Revised: 05/13/2016 Document Reviewed: 01/10/2015 Elsevier Interactive Patient Education  2017 [employer].

## 2022-10-18 ENCOUNTER — Ambulatory Visit (HOSPITAL_COMMUNITY)
Admission: RE | Admit: 2022-10-18 | Discharge: 2022-10-18 | Disposition: A | Discharge status: Home / Self Care | Payer: Medicare HMO | Source: Ambulatory Visit | Attending: Internal Medicine | Admitting: Internal Medicine

## 2022-10-18 DIAGNOSIS — E7849 Other hyperlipidemia: Secondary | ICD-10-CM | POA: Insufficient documentation

## 2022-10-18 DIAGNOSIS — R030 Elevated blood-pressure reading, without diagnosis of hypertension: Secondary | ICD-10-CM | POA: Insufficient documentation

## 2022-10-18 DIAGNOSIS — R7303 Prediabetes: Secondary | ICD-10-CM | POA: Insufficient documentation

## 2022-10-18 DIAGNOSIS — R9431 Abnormal electrocardiogram [ECG] [EKG]: Secondary | ICD-10-CM | POA: Insufficient documentation

## 2022-10-20 ENCOUNTER — Other Ambulatory Visit: Payer: Self-pay | Admitting: Internal Medicine

## 2022-10-20 ENCOUNTER — Encounter: Payer: Self-pay | Admitting: Internal Medicine

## 2022-10-20 ENCOUNTER — Telehealth: Payer: Self-pay | Admitting: Internal Medicine

## 2022-10-20 DIAGNOSIS — R931 Abnormal findings on diagnostic imaging of heart and coronary circulation: Secondary | ICD-10-CM

## 2022-10-20 NOTE — Telephone Encounter (Signed)
Patient called about message bellow:   Biagio Borg, MD  10/20/2022  3:12 PM EDT     The test results show that your current treatment is OK, except the Cardiac CT score is very high, even for your age.  We should continue your current medications, but also refer to Cardiology., and hopefully you will hear soon.    There is no other need for change of treatment or further evaluation based on these results, at this time.  thanks   He wonders what it means by very high. Call back is [phone]

## 2022-10-21 NOTE — Telephone Encounter (Signed)
Patient would like clarification of being very high, please advise.

## 2022-10-21 NOTE — Telephone Encounter (Signed)
I dont know how to explain very high any better than that

## 2022-10-27 DIAGNOSIS — C61 Malignant neoplasm of prostate: Secondary | ICD-10-CM | POA: Diagnosis not present

## 2022-10-27 DIAGNOSIS — R339 Retention of urine, unspecified: Secondary | ICD-10-CM | POA: Diagnosis not present

## 2022-11-01 DIAGNOSIS — R931 Abnormal findings on diagnostic imaging of heart and coronary circulation: Secondary | ICD-10-CM | POA: Insufficient documentation

## 2022-11-01 DIAGNOSIS — I10 Essential (primary) hypertension: Secondary | ICD-10-CM | POA: Insufficient documentation
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
## 2022-11-03 ENCOUNTER — Encounter: Payer: Self-pay | Admitting: Cardiology

## 2022-11-03 ENCOUNTER — Ambulatory Visit: Payer: Medicare HMO | Attending: Cardiology | Admitting: Cardiology

## 2022-11-03 VITALS — BP 128/72 | HR 78 | Ht 70.0 in | Wt 201.4 lb

## 2022-11-03 DIAGNOSIS — R0602 Shortness of breath: Secondary | ICD-10-CM | POA: Diagnosis not present

## 2022-11-03 DIAGNOSIS — I10 Essential (primary) hypertension: Secondary | ICD-10-CM

## 2022-11-03 DIAGNOSIS — R931 Abnormal findings on diagnostic imaging of heart and coronary circulation: Secondary | ICD-10-CM

## 2022-11-03 DIAGNOSIS — E785 Hyperlipidemia, unspecified: Secondary | ICD-10-CM | POA: Diagnosis not present

## 2022-11-03 NOTE — Patient Instructions (Signed)
Medication Instructions:   Recommend to purchase over the counter  BEFOTIAMINE - for neuropathy    *If you need a refill on your cardiac medications before your next appointment, please call your pharmacy*   Lab Work: Not needed    Testing/Procedures:  will be schedule at [place] has requested that you have an exercise tolerance test. Please also follow instruction sheet, as given.  Do not drink or eat foods with caffeine for 24 hours before the test. (Chocolate, coffee, tea, or energy drinks) If you use an inhaler, bring it with you to the test. Do not smoke for 4 hours before the test. Wear comfortable shoes and clothing.   Follow-Up: At [facility], you and your health needs are our priority.  As part of our continuing mission to provide you with exceptional heart care, we have created designated Provider Care Teams.  These Care Teams include your primary Cardiologist (physician) and Advanced Practice Providers (APPs -  Physician Assistants and Nurse Practitioners) who all work together to provide you with the care you need, when you need it.     Your next appointment:      The format for your next appointment:   In Person  Provider:   Minus Breeding, MD

## 2022-11-22 ENCOUNTER — Other Ambulatory Visit: Payer: Self-pay | Admitting: Internal Medicine

## 2022-11-22 NOTE — Telephone Encounter (Signed)
Please refill as per office routine med refill policy (all routine meds to be refilled for 3 mo or monthly (per pt preference) up to one year from last visit, then month to month grace period for 3 mo, then further med refills will have to be denied) ? ?

## 2022-11-23 ENCOUNTER — Telehealth (HOSPITAL_COMMUNITY): Payer: Self-pay

## 2022-11-23 ENCOUNTER — Telehealth (HOSPITAL_COMMUNITY): Payer: Self-pay | Admitting: Cardiology

## 2022-11-23 NOTE — Telephone Encounter (Signed)
Patient cancelled GXT scheduled per orders from Dr. Percival Spanish. Patient does not wish to have at this time and will call back at a later date to reschedule. Order will be removed from the active WQ.

## 2022-11-30 ENCOUNTER — Encounter (HOSPITAL_COMMUNITY): Payer: Medicare HMO

## 2022-11-30 DIAGNOSIS — C61 Malignant neoplasm of prostate: Secondary | ICD-10-CM | POA: Diagnosis not present

## 2022-11-30 DIAGNOSIS — R339 Retention of urine, unspecified: Secondary | ICD-10-CM | POA: Diagnosis not present

## 2022-12-28 ENCOUNTER — Ambulatory Visit: Payer: Medicare HMO | Admitting: Internal Medicine

## 2022-12-28 DIAGNOSIS — C61 Malignant neoplasm of prostate: Secondary | ICD-10-CM | POA: Diagnosis not present

## 2022-12-28 DIAGNOSIS — R339 Retention of urine, unspecified: Secondary | ICD-10-CM | POA: Diagnosis not present

## 2022-12-28 DIAGNOSIS — Z8546 Personal history of malignant neoplasm of prostate: Secondary | ICD-10-CM | POA: Diagnosis not present

## 2022-12-31 DIAGNOSIS — C61 Malignant neoplasm of prostate: Secondary | ICD-10-CM | POA: Diagnosis not present

## 2022-12-31 DIAGNOSIS — Z8546 Personal history of malignant neoplasm of prostate: Secondary | ICD-10-CM | POA: Diagnosis not present

## 2023-01-07 DIAGNOSIS — Z8546 Personal history of malignant neoplasm of prostate: Secondary | ICD-10-CM | POA: Diagnosis not present

## 2023-01-07 DIAGNOSIS — R8271 Bacteriuria: Secondary | ICD-10-CM | POA: Diagnosis not present

## 2023-01-25 DIAGNOSIS — Z8546 Personal history of malignant neoplasm of prostate: Secondary | ICD-10-CM | POA: Diagnosis not present

## 2023-01-25 DIAGNOSIS — C61 Malignant neoplasm of prostate: Secondary | ICD-10-CM | POA: Diagnosis not present

## 2023-01-25 DIAGNOSIS — R339 Retention of urine, unspecified: Secondary | ICD-10-CM | POA: Diagnosis not present

## 2023-02-06 ENCOUNTER — Other Ambulatory Visit: Payer: Self-pay | Admitting: Internal Medicine

## 2023-02-06 NOTE — Telephone Encounter (Signed)
Please refill as per office routine med refill policy (all routine meds to be refilled for 3 mo or monthly (per pt preference) up to one year from last visit, then month to month grace period for 3 mo, then further med refills will have to be denied) ? ?

## 2023-02-07 DIAGNOSIS — M19012 Primary osteoarthritis, left shoulder: Secondary | ICD-10-CM | POA: Diagnosis not present

## 2023-02-15 DIAGNOSIS — M720 Palmar fascial fibromatosis [Dupuytren]: Secondary | ICD-10-CM | POA: Diagnosis not present

## 2023-03-03 DIAGNOSIS — C61 Malignant neoplasm of prostate: Secondary | ICD-10-CM | POA: Diagnosis not present

## 2023-03-03 DIAGNOSIS — Z8546 Personal history of malignant neoplasm of prostate: Secondary | ICD-10-CM | POA: Diagnosis not present

## 2023-03-03 DIAGNOSIS — R339 Retention of urine, unspecified: Secondary | ICD-10-CM | POA: Diagnosis not present

## 2023-03-08 DIAGNOSIS — M19012 Primary osteoarthritis, left shoulder: Secondary | ICD-10-CM | POA: Diagnosis not present

## 2023-03-30 DIAGNOSIS — C61 Malignant neoplasm of prostate: Secondary | ICD-10-CM | POA: Diagnosis not present

## 2023-03-30 DIAGNOSIS — Z8546 Personal history of malignant neoplasm of prostate: Secondary | ICD-10-CM | POA: Diagnosis not present

## 2023-03-30 DIAGNOSIS — R339 Retention of urine, unspecified: Secondary | ICD-10-CM | POA: Diagnosis not present

## 2023-04-12 ENCOUNTER — Encounter: Payer: Medicare HMO | Admitting: Internal Medicine

## 2023-04-20 ENCOUNTER — Encounter: Payer: Medicare HMO | Admitting: Internal Medicine

## 2023-05-04 ENCOUNTER — Ambulatory Visit (INDEPENDENT_AMBULATORY_CARE_PROVIDER_SITE_OTHER): Payer: Medicare HMO | Admitting: Internal Medicine

## 2023-05-04 VITALS — BP 146/62 | HR 70 | Temp 97.8°F | Ht 70.0 in | Wt 198.0 lb

## 2023-05-04 DIAGNOSIS — E7849 Other hyperlipidemia: Secondary | ICD-10-CM | POA: Diagnosis not present

## 2023-05-04 DIAGNOSIS — Z0001 Encounter for general adult medical examination with abnormal findings: Secondary | ICD-10-CM | POA: Diagnosis not present

## 2023-05-04 DIAGNOSIS — E559 Vitamin D deficiency, unspecified: Secondary | ICD-10-CM | POA: Diagnosis not present

## 2023-05-04 DIAGNOSIS — K429 Umbilical hernia without obstruction or gangrene: Secondary | ICD-10-CM

## 2023-05-04 DIAGNOSIS — I10 Essential (primary) hypertension: Secondary | ICD-10-CM

## 2023-05-04 DIAGNOSIS — C61 Malignant neoplasm of prostate: Secondary | ICD-10-CM | POA: Diagnosis not present

## 2023-05-04 DIAGNOSIS — R7303 Prediabetes: Secondary | ICD-10-CM | POA: Diagnosis not present

## 2023-05-04 DIAGNOSIS — R030 Elevated blood-pressure reading, without diagnosis of hypertension: Secondary | ICD-10-CM

## 2023-05-04 DIAGNOSIS — M24542 Contracture, left hand: Secondary | ICD-10-CM

## 2023-05-04 DIAGNOSIS — M24541 Contracture, right hand: Secondary | ICD-10-CM

## 2023-05-04 LAB — CBC WITH DIFFERENTIAL/PLATELET
Basophils Absolute: 0 10*3/uL (ref 0.0–0.1)
Basophils Relative: 0.8 % (ref 0.0–3.0)
Eosinophils Absolute: 0.2 10*3/uL (ref 0.0–0.7)
Eosinophils Relative: 2.8 % (ref 0.0–5.0)
HCT: 43.6 % (ref 39.0–52.0)
Hemoglobin: 14.9 g/dL (ref 13.0–17.0)
Lymphocytes Relative: 22.8 % (ref 12.0–46.0)
Lymphs Abs: 1.3 10*3/uL (ref 0.7–4.0)
MCHC: 34.2 g/dL (ref 30.0–36.0)
MCV: 92.9 fl (ref 78.0–100.0)
Monocytes Absolute: 0.8 10*3/uL (ref 0.1–1.0)
Monocytes Relative: 13.6 % — ABNORMAL HIGH (ref 3.0–12.0)
Neutro Abs: 3.3 10*3/uL (ref 1.4–7.7)
Neutrophils Relative %: 60 % (ref 43.0–77.0)
Platelets: 275 10*3/uL (ref 150.0–400.0)
RBC: 4.69 Mil/uL (ref 4.22–5.81)
RDW: 14.6 % (ref 11.5–15.5)
WBC: 5.6 10*3/uL (ref 4.0–10.5)

## 2023-05-04 LAB — VITAMIN D 25 HYDROXY (VIT D DEFICIENCY, FRACTURES): VITD: 37.92 ng/mL (ref 30.00–100.00)

## 2023-05-04 LAB — BASIC METABOLIC PANEL
BUN: 14 mg/dL (ref 6–23)
CO2: 30 mEq/L (ref 19–32)
Calcium: 9.3 mg/dL (ref 8.4–10.5)
Chloride: 106 mEq/L (ref 96–112)
Creatinine, Ser: 1.07 mg/dL (ref 0.40–1.50)
GFR: 63.32 mL/min (ref >60.00)
Glucose, Bld: 97 mg/dL (ref 70–99)
Potassium: 5.2 mEq/L — ABNORMAL HIGH (ref 3.5–5.1)
Sodium: 142 mEq/L (ref 135–145)

## 2023-05-04 LAB — HEPATIC FUNCTION PANEL
ALT: 16 U/L (ref 0–53)
AST: 19 U/L (ref 0–37)
Albumin: 4 g/dL (ref 3.5–5.2)
Alkaline Phosphatase: 98 U/L (ref 39–117)
Bilirubin, Direct: 0.2 mg/dL (ref 0.0–0.3)
Total Bilirubin: 0.6 mg/dL (ref 0.2–1.2)
Total Protein: 6.4 g/dL (ref 6.0–8.3)

## 2023-05-04 LAB — VITAMIN B12: Vitamin B-12: 406 pg/mL (ref 211–911)

## 2023-05-04 LAB — LIPID PANEL
Cholesterol: 123 mg/dL (ref 0–200)
HDL: 46.2 mg/dL (ref >39.00)
LDL Cholesterol: 61 mg/dL (ref 0–99)
NonHDL: 76.36
Total CHOL/HDL Ratio: 3
Triglycerides: 79 mg/dL (ref 0.0–149.0)
VLDL: 15.8 mg/dL (ref 0.0–40.0)

## 2023-05-04 LAB — HEMOGLOBIN A1C: Hgb A1c MFr Bld: 6.7 % — ABNORMAL HIGH (ref 4.6–6.5)

## 2023-05-04 LAB — PSA: PSA: 0.12 ng/mL (ref 0.10–4.00)

## 2023-05-04 LAB — TSH: TSH: 2.15 u[IU]/mL (ref 0.35–5.50)
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
## 2023-05-04 NOTE — Patient Instructions (Signed)

## 2023-05-06 ENCOUNTER — Encounter: Payer: Self-pay | Admitting: Internal Medicine

## 2023-05-07 ENCOUNTER — Encounter: Payer: Self-pay | Admitting: Internal Medicine

## 2023-05-07 NOTE — Assessment & Plan Note (Addendum)
Last vitamin D Lab Results  Component Value Date   VD25OH 37.92 05/04/2023   Low, to increase oral replacement

## 2023-05-07 NOTE — Assessment & Plan Note (Signed)
Mod worsening, asympt, declines hand surgury for now

## 2023-05-07 NOTE — Assessment & Plan Note (Signed)

## 2023-05-07 NOTE — Assessment & Plan Note (Signed)
Mildly increased size, asympt, ok to follow

## 2023-05-07 NOTE — Assessment & Plan Note (Signed)
Lab Results  Component Value Date   HGBA1C 6.7 (H) 05/04/2023   Mild uncontrolled, pt to continue current medical treatment  - diet, wt control

## 2023-05-07 NOTE — Assessment & Plan Note (Signed)
Mod worsening, asympt, declines hand surgury for now 

## 2023-05-07 NOTE — Assessment & Plan Note (Signed)
BP Readings from Last 3 Encounters:  05/04/23 (!) 146/62  11/03/22 128/72  10/06/22 126/64   Uncontrolled here, but controlled at home per wife with him today, pt to continue medical treatment  - diet, wt control, decliens rx med for now

## 2023-05-07 NOTE — Assessment & Plan Note (Signed)
Lab Results  Component Value Date   LDLCALC 61 05/04/2023   Stable, pt to continue current statin lovastatin 40 mg qd

## 2023-05-07 NOTE — Assessment & Plan Note (Signed)
Lab Results  Component Value Date   PSA 0.12 05/04/2023   PSA 0.10 04/05/2022   PSA 0.17 11/15/2016   Stable, cont current tx

## 2023-05-11 DIAGNOSIS — M720 Palmar fascial fibromatosis [Dupuytren]: Secondary | ICD-10-CM | POA: Insufficient documentation

## 2023-05-17 ENCOUNTER — Telehealth: Payer: Self-pay | Admitting: Internal Medicine

## 2023-05-17 NOTE — Telephone Encounter (Signed)
Pt want to know if someone can go over or mail his blood work results. 

## 2023-05-17 NOTE — Telephone Encounter (Signed)
Ok to resend the letter I had done on May 06 2023    thanks

## 2023-05-19 DIAGNOSIS — C61 Malignant neoplasm of prostate: Secondary | ICD-10-CM | POA: Diagnosis not present

## 2023-05-19 DIAGNOSIS — Z8546 Personal history of malignant neoplasm of prostate: Secondary | ICD-10-CM | POA: Diagnosis not present

## 2023-05-19 DIAGNOSIS — R339 Retention of urine, unspecified: Secondary | ICD-10-CM | POA: Diagnosis not present

## 2023-06-02 NOTE — Telephone Encounter (Signed)
Patient's wife called and said they have not received the results that were mailed to them. They would like for them to be re-sent to the address on file. Best callback is [phone].

## 2023-06-03 NOTE — Telephone Encounter (Signed)
Printed labs and letter placed to be sent

## 2023-06-14 DIAGNOSIS — Z8546 Personal history of malignant neoplasm of prostate: Secondary | ICD-10-CM | POA: Diagnosis not present

## 2023-06-14 DIAGNOSIS — C61 Malignant neoplasm of prostate: Secondary | ICD-10-CM | POA: Diagnosis not present

## 2023-06-14 DIAGNOSIS — R339 Retention of urine, unspecified: Secondary | ICD-10-CM | POA: Diagnosis not present

## 2023-06-28 DIAGNOSIS — Z8546 Personal history of malignant neoplasm of prostate: Secondary | ICD-10-CM | POA: Diagnosis not present

## 2023-06-28 DIAGNOSIS — Z87891 Personal history of nicotine dependence: Secondary | ICD-10-CM | POA: Diagnosis not present

## 2023-06-28 DIAGNOSIS — E785 Hyperlipidemia, unspecified: Secondary | ICD-10-CM | POA: Diagnosis not present

## 2023-06-28 DIAGNOSIS — Z974 Presence of external hearing-aid: Secondary | ICD-10-CM | POA: Diagnosis not present

## 2023-06-28 DIAGNOSIS — Z8249 Family history of ischemic heart disease and other diseases of the circulatory system: Secondary | ICD-10-CM | POA: Diagnosis not present

## 2023-06-28 DIAGNOSIS — K219 Gastro-esophageal reflux disease without esophagitis: Secondary | ICD-10-CM | POA: Diagnosis not present

## 2023-06-28 DIAGNOSIS — M1990 Unspecified osteoarthritis, unspecified site: Secondary | ICD-10-CM | POA: Diagnosis not present

## 2023-06-28 DIAGNOSIS — Z7982 Long term (current) use of aspirin: Secondary | ICD-10-CM | POA: Diagnosis not present

## 2023-07-04 DIAGNOSIS — M720 Palmar fascial fibromatosis [Dupuytren]: Secondary | ICD-10-CM | POA: Diagnosis not present

## 2023-07-06 DIAGNOSIS — M25642 Stiffness of left hand, not elsewhere classified: Secondary | ICD-10-CM | POA: Diagnosis not present

## 2023-07-06 DIAGNOSIS — M720 Palmar fascial fibromatosis [Dupuytren]: Secondary | ICD-10-CM | POA: Diagnosis not present

## 2023-07-08 DIAGNOSIS — C61 Malignant neoplasm of prostate: Secondary | ICD-10-CM | POA: Diagnosis not present

## 2023-07-08 DIAGNOSIS — R339 Retention of urine, unspecified: Secondary | ICD-10-CM | POA: Diagnosis not present

## 2023-07-13 IMAGING — CT CT L SPINE W/O CM
3 of 4 series · 10 of 33 positions shown, 11 images · IV contrast (OMNIPAQUE 350)
Comparison: Radiography same day.  CT 03/19/2016

CLINICAL DATA: Fall.  Back pain.

EXAM:
CT LUMBAR SPINE WITHOUT CONTRAST
TECHNIQUE: Multidetector CT imaging of the lumbar spine was performed without
intravenous contrast administration. Multiplanar CT image
reconstructions were also generated.

[Series 1: axial l-spine · axial · 0.33mm/px · z∈[-229,-129]mm · 2 of 61 slices shown, 3 images]
[im 21/61  soft-tissue]
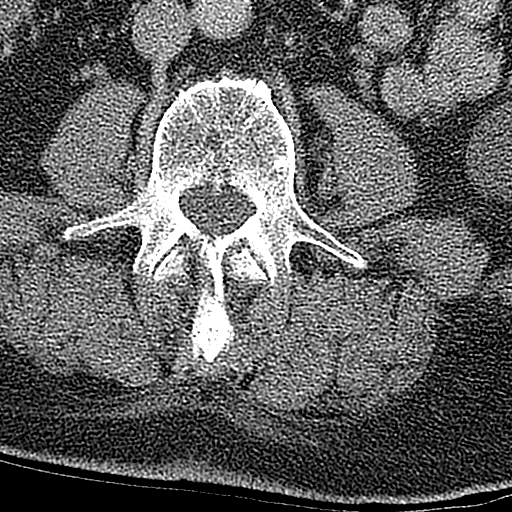
[im 21/61  bone]
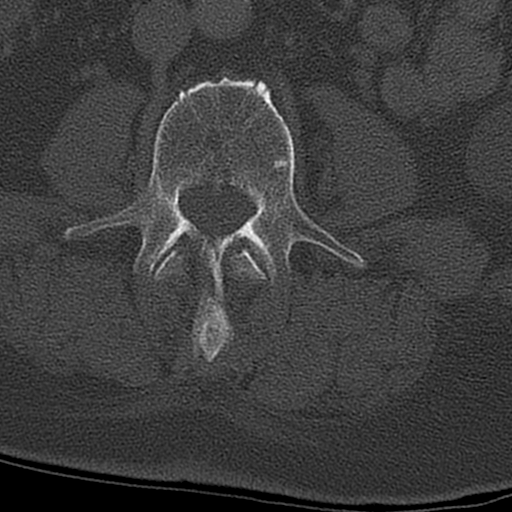
[im 41/61  bone]
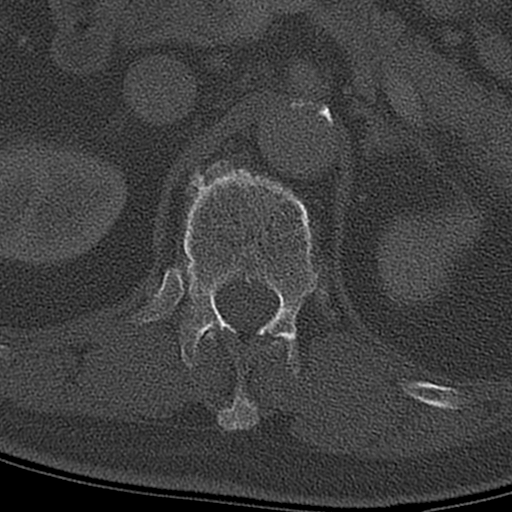

[Series 3: coronal l-spine · coronal · 0.37mm/px · 3 of 85 slices shown]
[im 17/85  bone]
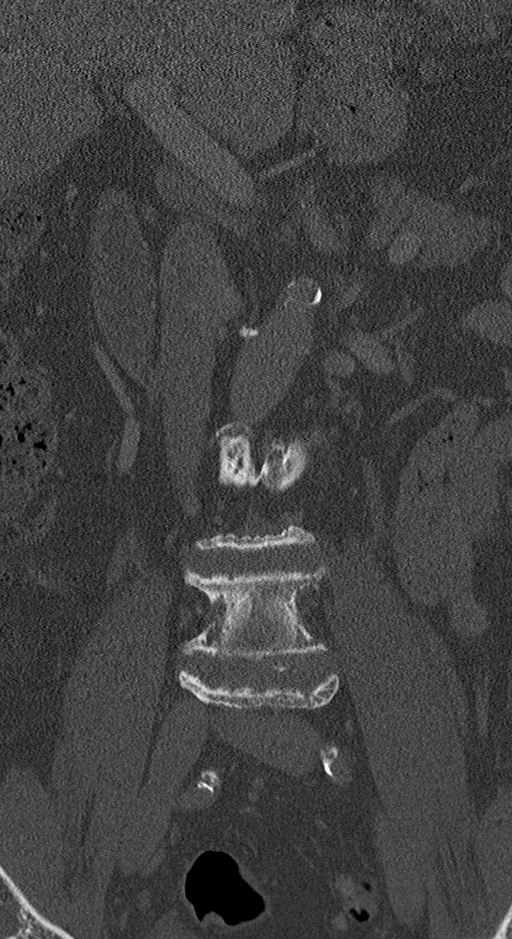
[im 34/85  bone]
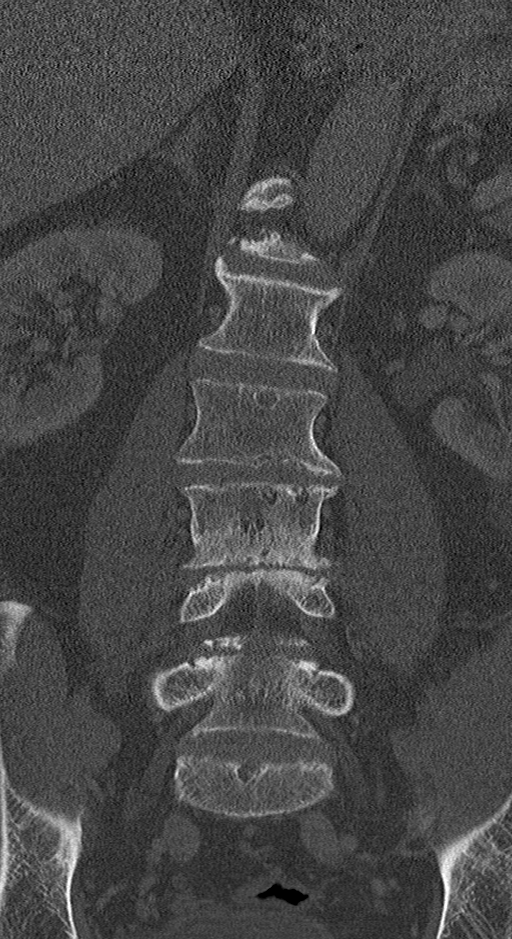
[im 51/85  bone]
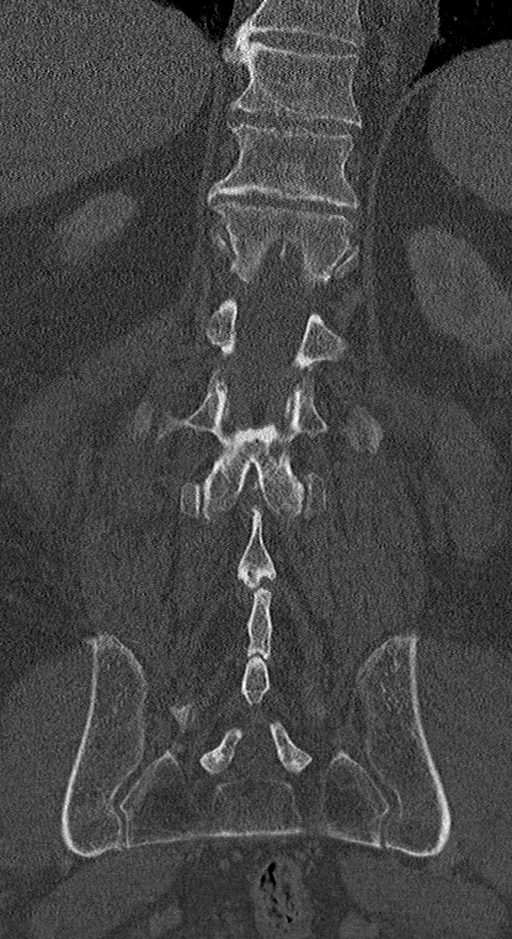

[Series 4: sagittal st · sagittal · 0.32mm/px · 5 of 101 slices shown]
[im 34/101  bone]
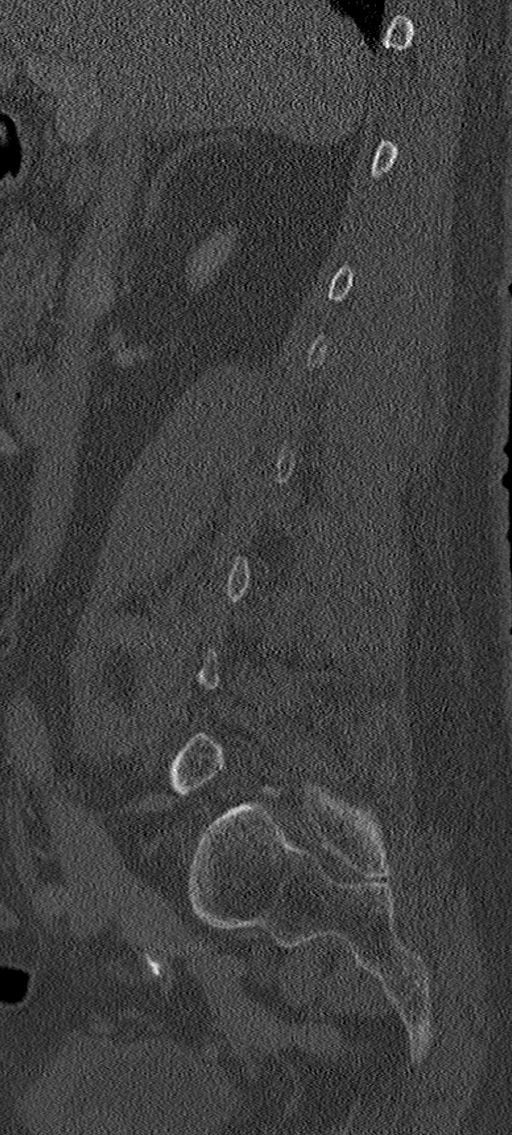
[im 42/101  bone]
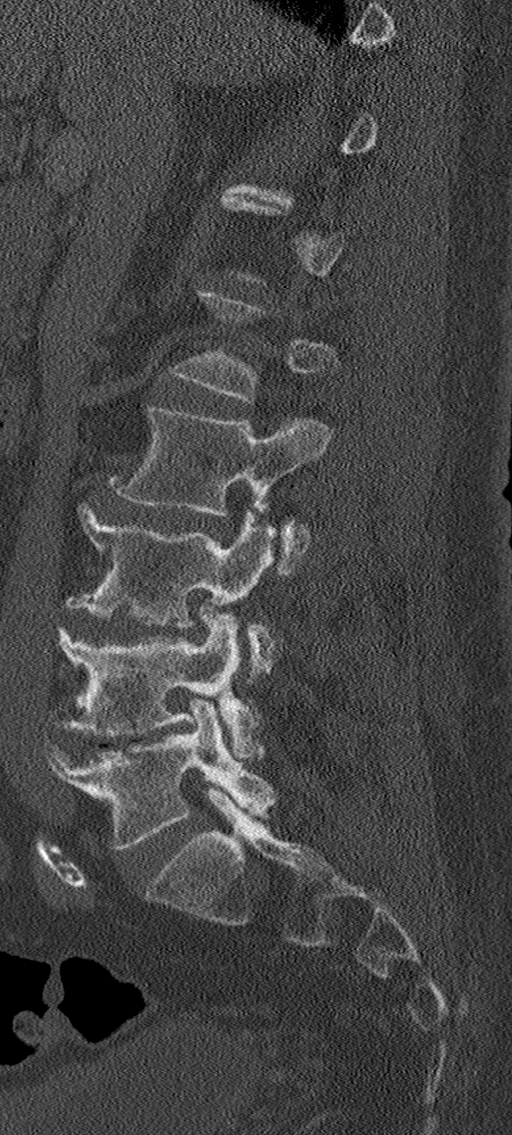
[im 51/101  bone]
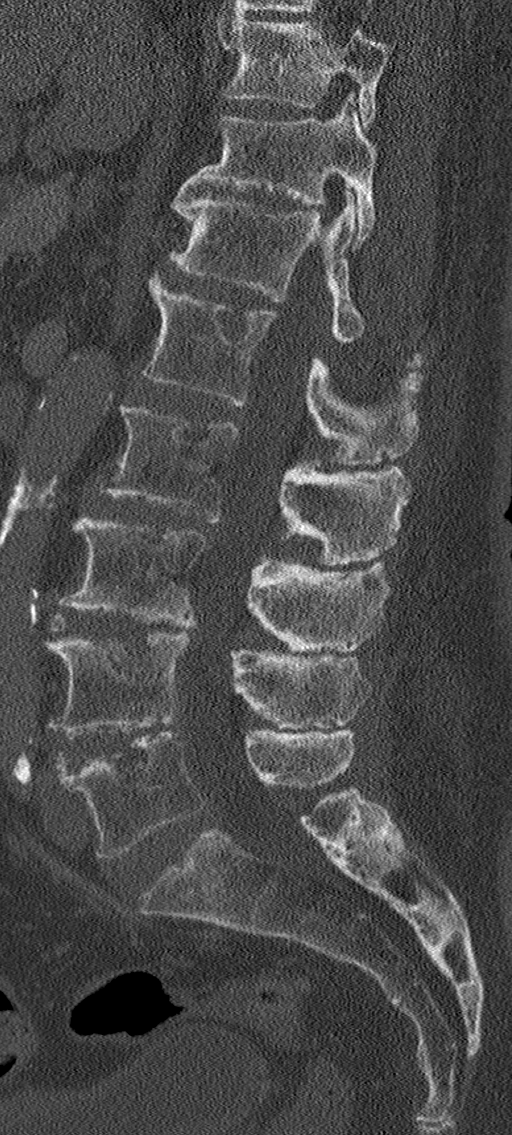
[im 59/101  bone]
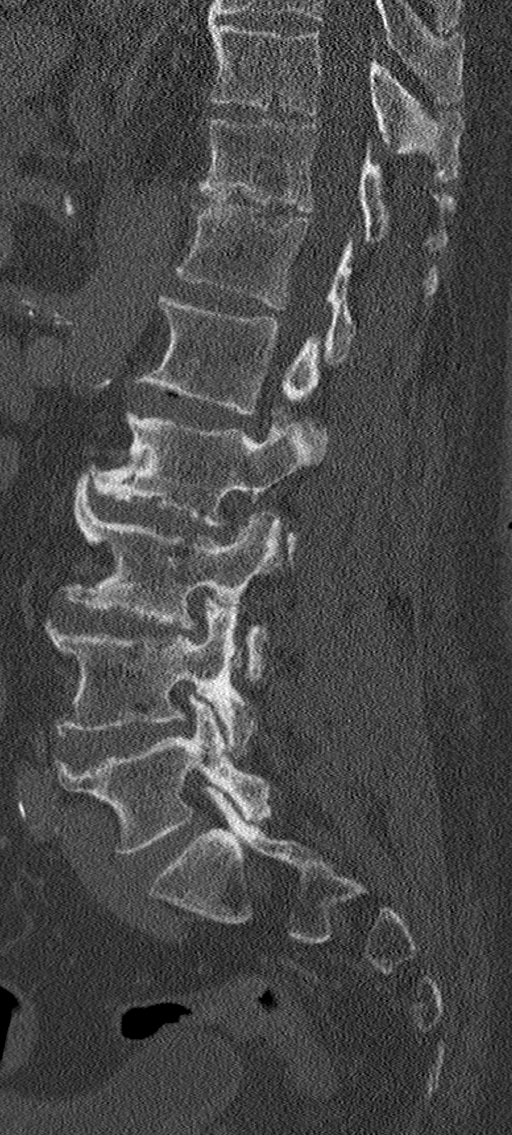
[im 67/101  bone]
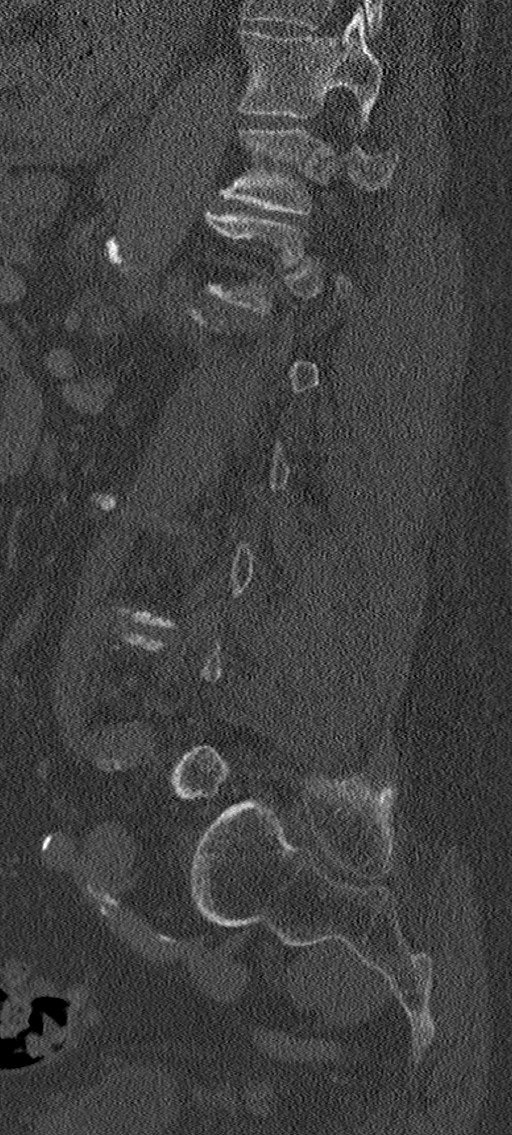

[10 of 33 positions shown; findings below may reference images not displayed]

FINDINGS: Segmentation: 5 lumbar type vertebral bodies.

Alignment: Mild scoliotic curvature convex to the right.

Vertebrae: No fracture or focal bone lesion. Chronic discogenic
endplate changes.

Paraspinal and other soft tissues: Aortic atherosclerosis. Otherwise
negative.

Disc levels: T11-12: Chronic disc degeneration with loss of disc
height. No compressive stenosis.

T12-L1: Normal interspace.  Insignificant Schmorl's node.

L1-2: Normal interspace.  Insignificant Schmorl's node.

L2-3: Bulging of the disc. Mild facet and ligamentous hypertrophy.
Mild stenosis of both lateral recesses without definite neural
compression. Chronic discogenic endplate changes.

L3-4: Endplate osteophytes and bulging of the disc. Facet and
ligamentous hypertrophy. Mild multifactorial spinal stenosis that
could potentially be significant. Chronic discogenic endplate
changes.

L4-5: Disc degeneration with loss of disc height. Chronic discogenic
endplate changes. Endplate osteophytes more prominent towards the
right. Facet and ligamentous hypertrophy. Stenosis of the lateral
recesses and foramina, right more than left.

L5-S1: Mild bulging of the disc. Mild facet osteoarthritis. No
compressive canal or foraminal narrowing.

Overall, the findings appear similar to the study February 2016,
with only mild progressive degenerative worsening.
IMPRESSION: No acute or traumatic finding. Chronic degenerative disc disease and
degenerative facet disease throughout the region which could
certainly relate to back pain. Mild progressive worsening since
Thursday February, 2016. Spinal stenosis with potential for neural compression
at L3-4 and L4-5.

## 2023-07-13 IMAGING — CR DG LUMBAR SPINE COMPLETE 4+V
5 series · 5 of 5 positions shown · non-contrast
Comparison: No priors.

CLINICAL DATA: 85-year-old male with history of trauma from a fall
complaining of low back pain.

EXAM:
LUMBAR SPINE - COMPLETE 4+ VIEW

[t lumbar spine ap]
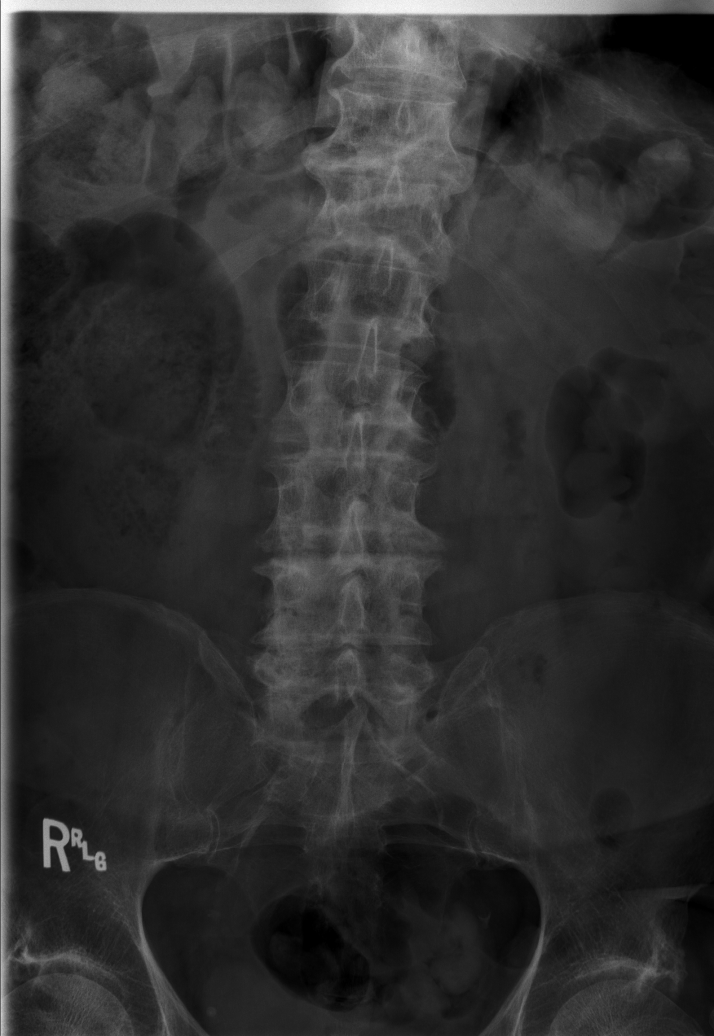

[t lumbar spine obl (1 of 3)]
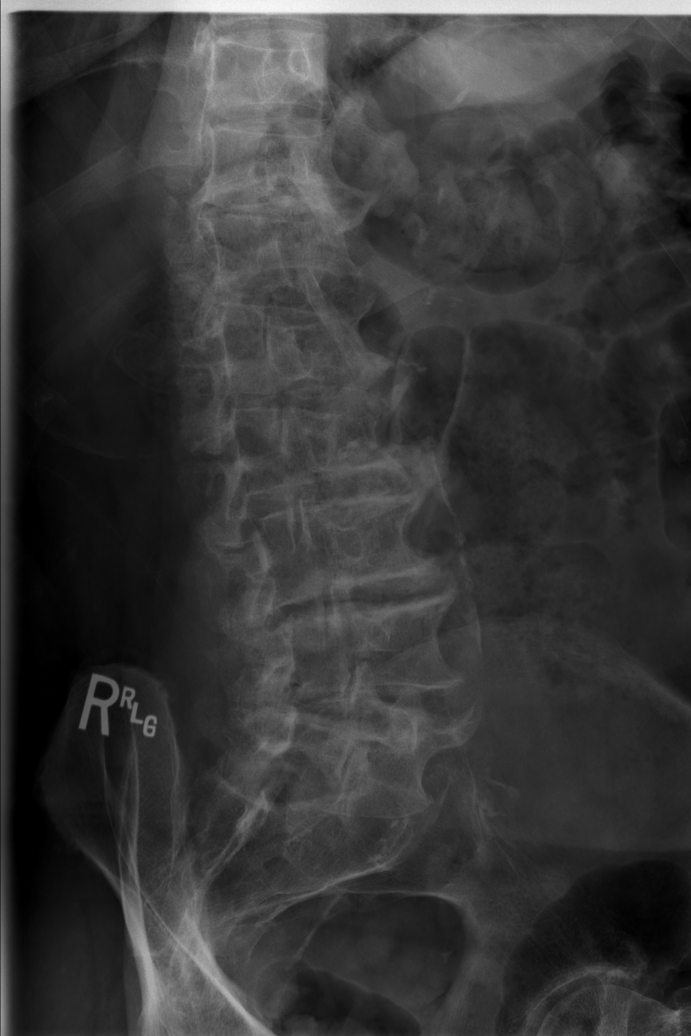

[t lumbar spine obl (2 of 3)]
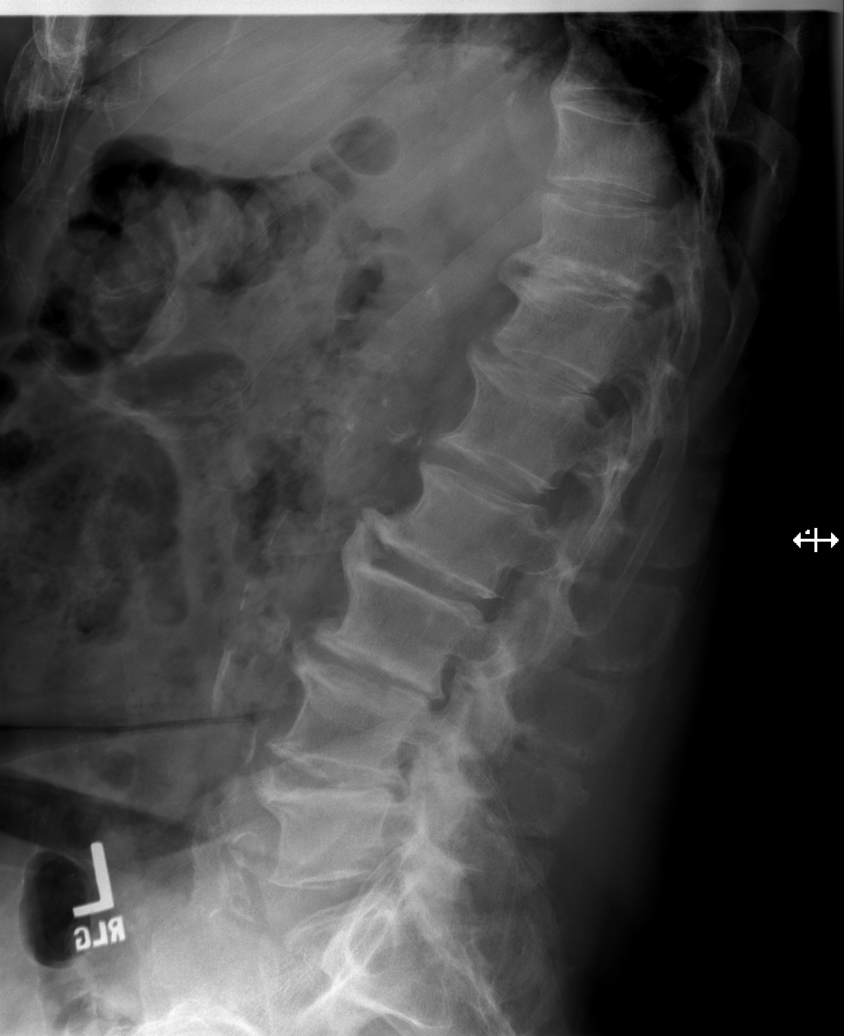

[t lumbar l-5 s-1 spot]
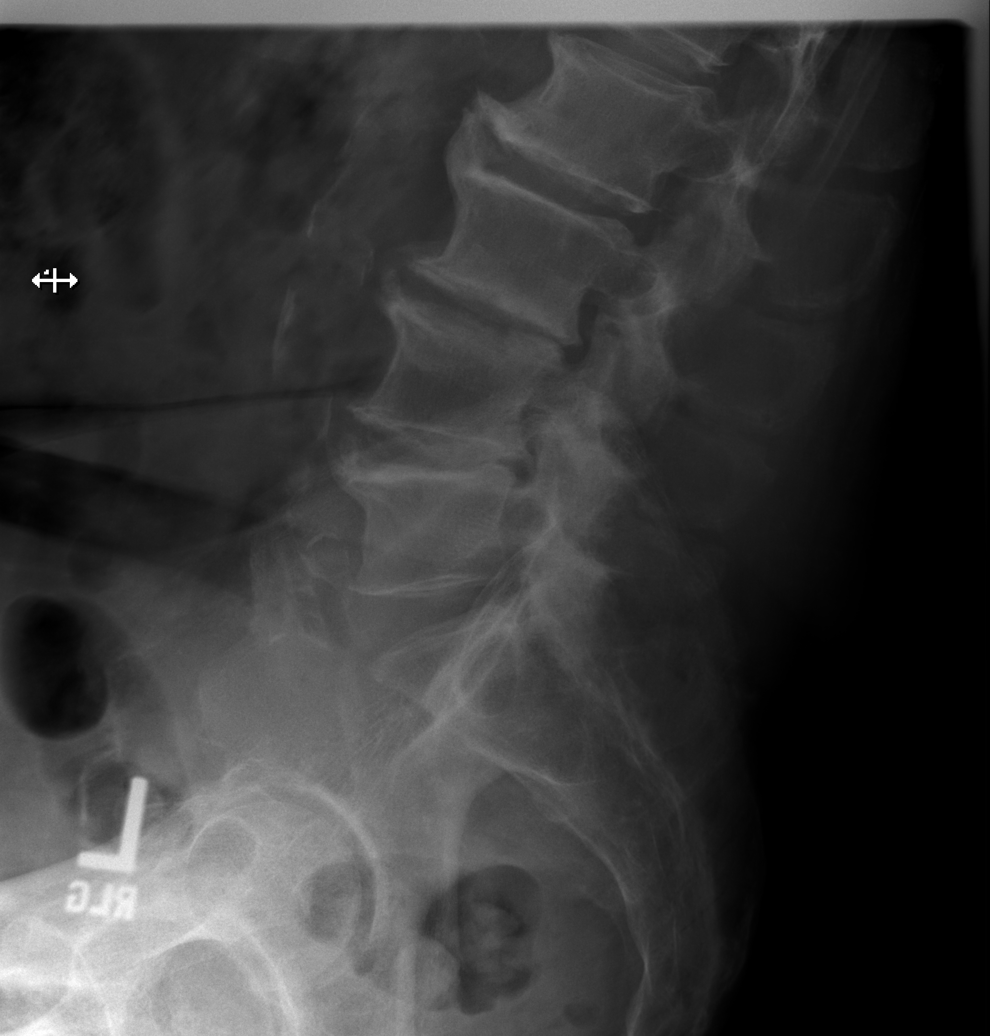

[t lumbar spine obl (3 of 3)]
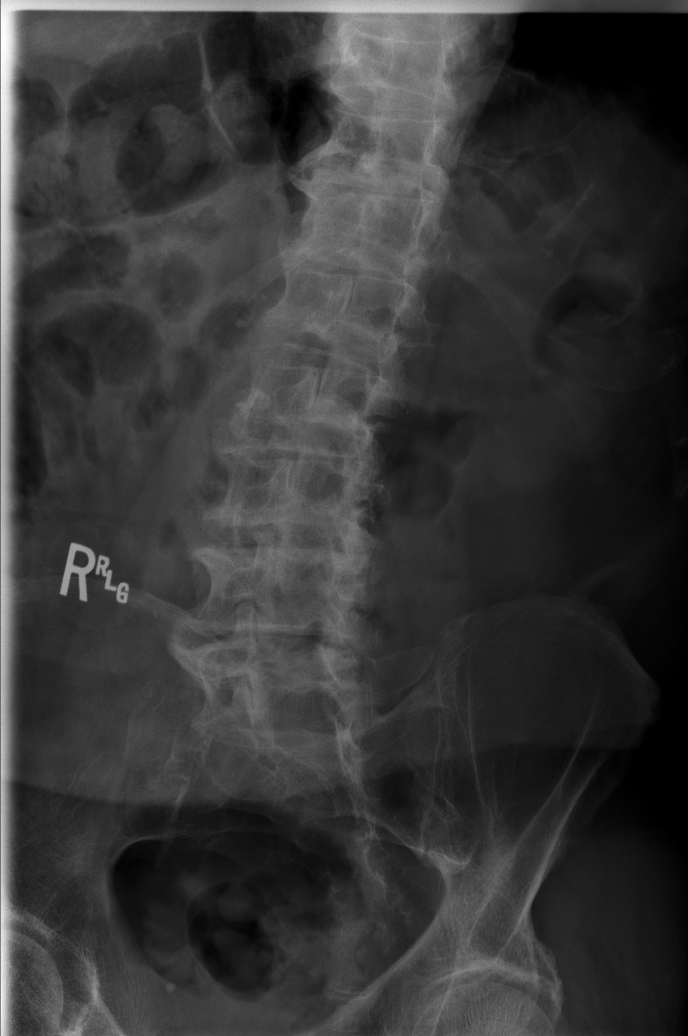

[5 of 5 positions shown; findings below may reference images not displayed]

FINDINGS: Five views of the lumbar spine demonstrate no definite acute
displaced fracture or compression type fracture. There is severe
multilevel degenerative disc disease, most pronounced at L3-L4 and
L4-L5. Severe multilevel facet arthropathy, most severe at L4-L5 and
L5-S1. No defects of the pars interarticularis. Extensive vascular
calcifications are noted in the abdominal aorta.
IMPRESSION: 1. No acute radiographic abnormality of the lumbar spine.
2. Severe multilevel degenerative disc disease and lumbar
spondylosis, as above.
3. Aortic atherosclerosis.

## 2023-07-28 DIAGNOSIS — Z4789 Encounter for other orthopedic aftercare: Secondary | ICD-10-CM | POA: Diagnosis not present

## 2023-07-28 DIAGNOSIS — M720 Palmar fascial fibromatosis [Dupuytren]: Secondary | ICD-10-CM | POA: Diagnosis not present

## 2023-08-05 DIAGNOSIS — C61 Malignant neoplasm of prostate: Secondary | ICD-10-CM | POA: Diagnosis not present

## 2023-08-05 DIAGNOSIS — R339 Retention of urine, unspecified: Secondary | ICD-10-CM | POA: Diagnosis not present

## 2023-08-23 DIAGNOSIS — M720 Palmar fascial fibromatosis [Dupuytren]: Secondary | ICD-10-CM | POA: Diagnosis not present

## 2023-08-24 ENCOUNTER — Other Ambulatory Visit: Payer: Self-pay

## 2023-08-24 ENCOUNTER — Other Ambulatory Visit: Payer: Self-pay | Admitting: Internal Medicine

## 2023-08-30 DIAGNOSIS — M25641 Stiffness of right hand, not elsewhere classified: Secondary | ICD-10-CM | POA: Diagnosis not present

## 2023-09-08 DIAGNOSIS — C61 Malignant neoplasm of prostate: Secondary | ICD-10-CM | POA: Diagnosis not present

## 2023-09-08 DIAGNOSIS — Z8546 Personal history of malignant neoplasm of prostate: Secondary | ICD-10-CM | POA: Diagnosis not present

## 2023-09-08 DIAGNOSIS — R339 Retention of urine, unspecified: Secondary | ICD-10-CM | POA: Diagnosis not present

## 2023-09-09 DIAGNOSIS — M25641 Stiffness of right hand, not elsewhere classified: Secondary | ICD-10-CM | POA: Diagnosis not present

## 2023-09-23 ENCOUNTER — Ambulatory Visit: Payer: Medicare HMO

## 2023-09-27 ENCOUNTER — Ambulatory Visit (INDEPENDENT_AMBULATORY_CARE_PROVIDER_SITE_OTHER): Payer: Medicare HMO

## 2023-09-27 DIAGNOSIS — Z23 Encounter for immunization: Secondary | ICD-10-CM | POA: Diagnosis not present

## 2023-09-27 NOTE — Progress Notes (Signed)
Patient presented in office for his HD Flu Vaccine. HD Flu Vaccine was administered into his Left Deltoid Muscle. Patient tolerated the injection well and the injection site looked well. Patient was advised to report to the office immediately if he notices any adverse reactions.

## 2023-09-30 ENCOUNTER — Ambulatory Visit: Payer: Medicare HMO | Admitting: Nurse Practitioner

## 2023-09-30 VITALS — BP 128/66 | HR 74 | Temp 97.8°F | Ht 70.0 in | Wt 198.0 lb

## 2023-09-30 DIAGNOSIS — R300 Dysuria: Secondary | ICD-10-CM | POA: Diagnosis not present

## 2023-09-30 LAB — POCT URINALYSIS DIPSTICK
Bilirubin, UA: NEGATIVE
Blood, UA: POSITIVE
Glucose, UA: NEGATIVE
Ketones, UA: NEGATIVE
Nitrite, UA: NEGATIVE
Protein, UA: POSITIVE — AB
Spec Grav, UA: 1.02 (ref 1.010–1.025)
Urobilinogen, UA: 0.2 U/dL — AB
pH, UA: 6 (ref 5.0–8.0)

## 2023-09-30 MED ORDER — CEFDINIR 300 MG PO CAPS
300.0000 mg | ORAL_CAPSULE | Freq: Two times a day (BID) | ORAL | 0 refills | Status: AC
Start: 2023-09-30 — End: 2023-10-07

## 2023-09-30 NOTE — Progress Notes (Signed)
Established Patient Office Visit  Subjective   Patient ID: Daniel Rowland, male    DOB: March 11, 1935  Age: 88 y.o. MRN: [id number]  Chief Complaint  Patient presents with   Dysuria    Symptom onset earlier this week.  Reports dysuria and urinary frequency.  No nausea, vomiting, diarrhea, flank pain, or hematuria.  Reports has had UTIs in the past, and symptoms feel similar to these infections that he has had before.  Wife accompanies him today and reports they took an at home test which did show positive for leukocytes.    ROS: see HPI    Objective:     BP 128/66   Pulse 74   Temp 97.8 F (36.6 C) (Temporal)   Ht 5\' 10"  (1.778 m)   Wt 198 lb (89.8 kg)   SpO2 96%   BMI 28.41 kg/m    Physical Exam Vitals reviewed.  Constitutional:      Appearance: Normal appearance.  HENT:     Head: Normocephalic and atraumatic.  Cardiovascular:     Rate and Rhythm: Normal rate and regular rhythm.  Pulmonary:     Effort: Pulmonary effort is normal.     Breath sounds: Normal breath sounds.  Abdominal:     Tenderness: There is no right CVA tenderness or left CVA tenderness.  Musculoskeletal:     Cervical back: Neck supple.  Skin:    General: Skin is warm and dry.  Neurological:     Mental Status: He is alert and oriented to person, place, and time.  Psychiatric:        Mood and Affect: Mood normal.        Behavior: Behavior normal.        Thought Content: Thought content normal.        Judgment: Judgment normal.      Results for orders placed or performed in visit on 09/30/23  POCT urinalysis dipstick  Result Value Ref Range   Color, UA     Clarity, UA     Glucose, UA Negative Negative   Bilirubin, UA negative    Ketones, UA negative    Spec Grav, UA 1.020 1.010 - 1.025   Blood, UA positive    pH, UA 6.0 5.0 - 8.0   Protein, UA Positive (A) Negative   Urobilinogen, UA 0.2 (A) 0.2 or 1.0 E.U./dL   Nitrite, UA negative    Leukocytes, UA Large (3+) (A) Negative    Appearance     Odor        The ASCVD Risk score (Arnett DK, et al., 2019) failed to calculate for the following reasons:   The 2019 ASCVD risk score is only valid for ages 74 to 17    Assessment & Plan:   Problem List Items Addressed This Visit       Other   Dysuria - Primary    Acute Point-of-care urinalysis consistent with UTI Treat with cefdinir 300 mg twice daily x 7 days Send off urine culture Patient told to return to office if symptoms do not improve      Relevant Medications   cefdinir (OMNICEF) 300 MG capsule   Other Relevant Orders   POCT urinalysis dipstick (Completed)   Urine Culture    Return if symptoms worsen or fail to improve.    Elenore Paddy, NP

## 2023-09-30 NOTE — Assessment & Plan Note (Signed)
Acute Point-of-care urinalysis consistent with UTI Treat with cefdinir 300 mg twice daily x 7 days Send off urine culture Patient told to return to office if symptoms do not improve

## 2023-10-02 LAB — URINE CULTURE

## 2023-10-07 DIAGNOSIS — C61 Malignant neoplasm of prostate: Secondary | ICD-10-CM | POA: Diagnosis not present

## 2023-10-07 DIAGNOSIS — R339 Retention of urine, unspecified: Secondary | ICD-10-CM | POA: Diagnosis not present

## 2023-10-07 DIAGNOSIS — Z8546 Personal history of malignant neoplasm of prostate: Secondary | ICD-10-CM | POA: Diagnosis not present

## 2023-11-03 DIAGNOSIS — C61 Malignant neoplasm of prostate: Secondary | ICD-10-CM | POA: Diagnosis not present

## 2023-11-03 DIAGNOSIS — Z8546 Personal history of malignant neoplasm of prostate: Secondary | ICD-10-CM | POA: Diagnosis not present

## 2023-11-03 DIAGNOSIS — R339 Retention of urine, unspecified: Secondary | ICD-10-CM | POA: Diagnosis not present

## 2023-11-08 ENCOUNTER — Telehealth: Payer: Self-pay | Admitting: Internal Medicine

## 2023-11-08 DIAGNOSIS — K429 Umbilical hernia without obstruction or gangrene: Secondary | ICD-10-CM

## 2023-11-08 NOTE — Telephone Encounter (Signed)
Patient's wife called and said patient has an umbilical hernia and would like a referral to Dr. Darnell Level at [facility] on [address] in [place]. Best callback is [phone].

## 2023-11-08 NOTE — Telephone Encounter (Signed)
Ok referral done 

## 2023-11-30 DIAGNOSIS — C61 Malignant neoplasm of prostate: Secondary | ICD-10-CM | POA: Diagnosis not present

## 2023-11-30 DIAGNOSIS — R339 Retention of urine, unspecified: Secondary | ICD-10-CM | POA: Diagnosis not present

## 2023-12-08 ENCOUNTER — Ambulatory Visit: Payer: Self-pay | Admitting: Surgery

## 2023-12-08 DIAGNOSIS — K4090 Unilateral inguinal hernia, without obstruction or gangrene, not specified as recurrent: Secondary | ICD-10-CM | POA: Diagnosis not present

## 2023-12-08 DIAGNOSIS — K429 Umbilical hernia without obstruction or gangrene: Secondary | ICD-10-CM | POA: Diagnosis not present

## 2024-01-02 ENCOUNTER — Encounter (HOSPITAL_COMMUNITY): Payer: Self-pay

## 2024-01-02 DIAGNOSIS — Z8546 Personal history of malignant neoplasm of prostate: Secondary | ICD-10-CM | POA: Diagnosis not present

## 2024-01-02 DIAGNOSIS — R339 Retention of urine, unspecified: Secondary | ICD-10-CM | POA: Diagnosis not present

## 2024-01-02 DIAGNOSIS — C61 Malignant neoplasm of prostate: Secondary | ICD-10-CM | POA: Diagnosis not present

## 2024-01-02 NOTE — Progress Notes (Addendum)
PCP -Oliver Barre, MD   / Theron Arista with Jiles Prows, NP 09-30-23 epic Cardiologist - Rollene Rotunda, MD  Theron Arista 11-03-22 epic  PPM/ICD -  Device Orders -  Rep Notified -   Chest x-ray -  EKG -  Stress Test -  ECHO -  Cardiac Cath -  Ct cardiac scoring-10-18-22 EPIC  SCORE 2278  Sleep Study -  CPAP -   Fasting Blood Sugar -  Checks Blood Sugar _____ times a day  Blood Thinner Instructions: Aspirin Instructions:  ERAS Protcol - PRE-SURGERY-no drink   COVID vaccine -yes  Activity--Had SOB walking from parking lot to preop had to stop 2 x No CP  Anesthesia review: HTN no meds, See Cardiac CT score, pre DM  Patient denies shortness of breath, fever, cough and chest pain at PAT appointment   All instructions explained to the patient, with a verbal understanding of the material. Patient agrees to go over the instructions while at home for a better understanding. Patient also instructed to self quarantine after being tested for COVID-19. The opportunity to ask questions was provided.

## 2024-01-02 NOTE — Patient Instructions (Addendum)
SURGICAL WAITING ROOM VISITATION  Patients having surgery or a procedure may have no more than 2 support people in the waiting area - these visitors may rotate.    Children under the age of 67 must have an adult with them who is not the patient.  If the patient needs to stay at the hospital during part of their recovery, the visitor guidelines for inpatient rooms apply. Pre-op nurse will coordinate an appropriate time for 1 support person to accompany patient in pre-op.  This support person may not rotate.    Please refer to the [facility]: [facility] website for the visitor guidelines for Inpatients (after your surgery is over and you are in a regular room).       Your procedure is scheduled on: 01-20-24   Report to [facility] Main Entrance    Report to admitting at      11:15 AM   Call this number if you have problems the morning of surgery [phone]   Do not eat food :After Midnight.   After Midnight you may have the following liquids until __10:30____ AM/ DAY OF SURGERY   then nothing by mouth  Water Non-Citrus Juices (without pulp, NO RED-Apple, White grape, White cranberry) Black Coffee (NO MILK/CREAM OR CREAMERS, sugar ok)  Clear Tea (NO MILK/CREAM OR CREAMERS, sugar ok) regular and decaf                             Plain Jell-O (NO RED)                                           Fruit ices (not with fruit pulp, NO RED)                                     Popsicles (NO RED)                                                               Sports drinks like Gatorade (NO RED)                             If you have questions, please contact your surgeon's office.   FOLLOW ANY ADDITIONAL PRE OP INSTRUCTIONS YOU RECEIVED FROM YOUR SURGEON'S OFFICE!!!     Oral Hygiene is also important to reduce your risk of infection.                                    Remember - BRUSH YOUR TEETH THE MORNING OF SURGERY WITH YOUR REGULAR TOOTHPASTE  DENTURES WILL BE REMOVED PRIOR TO  SURGERY PLEASE DO NOT APPLY "Poly grip" OR ADHESIVES!!!   Do NOT smoke after Midnight   Stop all vitamins and herbal supplements 7 days before surgery.   Take these medicines the morning of surgery with A SIP OF WATER: pantoprazole, and  tylenol if needed  You may not have any metal on your body including hair pins, jewelry, and body piercing             Do not wear lotions, powders, /cologne, or deodorant              Men may shave face and neck.   Do not bring valuables to the hospital. [place] IS NOT             RESPONSIBLE   FOR VALUABLES.   Contacts, glasses, dentures or bridgework may not be worn into surgery.   Bring small overnight bag day of surgery.   DO NOT BRING YOUR HOME MEDICATIONS TO THE HOSPITAL. PHARMACY WILL DISPENSE MEDICATIONS LISTED ON YOUR MEDICATION LIST TO YOU DURING YOUR ADMISSION IN THE HOSPITAL!    Patients discharged on the day of surgery will not be allowed to drive home.  Someone NEEDS to stay with you for the first 24 hours after anesthesia.   Special Instructions: Bring a copy of your healthcare power of attorney and living will documents the day of surgery if you haven't scanned them before.              Please read over the following fact sheets you were given: IF YOU HAVE QUESTIONS ABOUT YOUR PRE-OP INSTRUCTIONS PLEASE CALL [phone]    If you test positive for Covid or have been in contact with anyone that has tested positive in the last 10 days please notify you surgeon.    [place] - Preparing for Surgery Before surgery, you can play an important role.  Because skin is not sterile, your skin needs to be as free of germs as possible.  You can reduce the number of germs on your skin by washing with CHG (chlorahexidine gluconate) soap before surgery.  CHG is an antiseptic cleaner which kills germs and bonds with the skin to continue killing germs even after washing. Please DO NOT use if you have an allergy to  CHG or antibacterial soaps.  If your skin becomes reddened/irritated stop using the CHG and inform your nurse when you arrive at Short Stay. Do not shave (including legs and underarms) for at least 48 hours prior to the first CHG shower.  You may shave your face/neck. Please follow these instructions carefully:  1.  Shower with CHG Soap the night before surgery and the  morning of Surgery.  2.  If you choose to wash your hair, wash your hair first as usual with your  normal  shampoo.  3.  After you shampoo, rinse your hair and body thoroughly to remove the  shampoo.                           4.  Use CHG as you would any other liquid soap.  You can apply chg directly  to the skin and wash                       Gently with a scrungie or clean washcloth.  5.  Apply the CHG Soap to your body ONLY FROM THE NECK DOWN.   Do not use on face/ open                           Wound or open sores. Avoid contact with eyes, ears mouth and genitals (private parts).  Wash face,  Genitals (private parts) with your normal soap.             6.  Wash thoroughly, paying special attention to the area where your surgery  will be performed.  7.  Thoroughly rinse your body with warm water from the neck down.  8.  DO NOT shower/wash with your normal soap after using and rinsing off  the CHG Soap.                9.  Pat yourself dry with a clean towel.            10.  Wear clean pajamas.            11.  Place clean sheets on your bed the night of your first shower and do not  sleep with pets. Day of Surgery : Do not apply any lotions/deodorants the morning of surgery.  Please wear clean clothes to the hospital/surgery center.  FAILURE TO FOLLOW THESE INSTRUCTIONS MAY RESULT IN THE CANCELLATION OF YOUR SURGERY PATIENT SIGNATURE_________________________________  NURSE SIGNATURE__________________________________  ________________________________________________________________________

## 2024-01-13 ENCOUNTER — Encounter (HOSPITAL_COMMUNITY)
Admission: RE | Admit: 2024-01-13 | Discharge: 2024-01-13 | Disposition: A | Discharge status: Home / Self Care | Payer: Medicare HMO | Source: Ambulatory Visit | Attending: Surgery | Admitting: Surgery

## 2024-01-13 ENCOUNTER — Encounter (HOSPITAL_COMMUNITY): Payer: Self-pay

## 2024-01-13 ENCOUNTER — Other Ambulatory Visit: Payer: Self-pay

## 2024-01-13 VITALS — BP 140/75 | HR 85 | Temp 97.8°F | Resp 16 | Ht 70.0 in | Wt 195.0 lb

## 2024-01-13 DIAGNOSIS — Z01818 Encounter for other preprocedural examination: Secondary | ICD-10-CM | POA: Insufficient documentation

## 2024-01-13 DIAGNOSIS — I10 Essential (primary) hypertension: Secondary | ICD-10-CM | POA: Insufficient documentation

## 2024-01-13 HISTORY — DX: Other specified health status: Z78.9

## 2024-01-13 HISTORY — DX: Transient cerebral ischemic attack, unspecified: G45.9

## 2024-01-13 HISTORY — DX: Prediabetes: R73.03

## 2024-01-13 LAB — CBC
HCT: 37.2 % — ABNORMAL LOW (ref 39.0–52.0)
Hemoglobin: 12 g/dL — ABNORMAL LOW (ref 13.0–17.0)
MCH: 29.2 pg (ref 26.0–34.0)
MCHC: 32.3 g/dL (ref 30.0–36.0)
MCV: 90.5 fL (ref 80.0–100.0)
Platelets: 324 10*3/uL (ref 150–400)
RBC: 4.11 MIL/uL — ABNORMAL LOW (ref 4.22–5.81)
RDW: 13.1 % (ref 11.5–15.5)
WBC: 6.6 10*3/uL (ref 4.0–10.5)
nRBC: 0 % (ref 0.0–0.2)

## 2024-01-13 LAB — BASIC METABOLIC PANEL
Anion gap: 10 (ref 5–15)
BUN: 18 mg/dL (ref 8–23)
CO2: 24 mmol/L (ref 22–32)
Calcium: 9 mg/dL (ref 8.9–10.3)
Chloride: 108 mmol/L (ref 98–111)
Creatinine, Ser: 1.04 mg/dL (ref 0.61–1.24)
GFR, Estimated: >60 mL/min (ref >60)
Glucose, Bld: 135 mg/dL — ABNORMAL HIGH (ref 70–99)
Potassium: 3.7 mmol/L (ref 3.5–5.1)
Sodium: 142 mmol/L (ref 135–145)

## 2024-01-15 ENCOUNTER — Encounter (HOSPITAL_COMMUNITY): Payer: Self-pay | Admitting: Surgery

## 2024-01-15 NOTE — H&P (Signed)
REFERRING PHYSICIAN: Corwin Levins, MD  PROVIDER: Colden Samaras Myra Rude, MD   Chief Complaint: New Consultation and Umbilical Hernia  History of Present Illness:  Patient is referred by Dr. Oliver Barre for surgical evaluation and management of an enlarging, mildly symptomatic umbilical hernia. This hernia has been present for several years. It was noted on a CT scan in 2022. It is gradually increased in size. Patient is wearing an abdominal binder for comfort. He denies any signs or symptoms of obstruction. He has had no prior abdominal surgery. Of note the patient did undergo treatment for prostate cancer with radioactive seeds by Dr. Ihor Gully. He has to self catheterize 3 or 4 times daily. Also noted on CT was a left inguinal hernia. This has been asymptomatic. It apparently has been present for many years. The patient does not wish to discuss repair of his left inguinal hernia at this time.  Review of Systems: A complete review of systems was obtained from the patient. I have reviewed this information and discussed as appropriate with the patient. See HPI as well for other ROS.  Review of Systems  Constitutional: Negative.  HENT: Positive for hearing loss.  Eyes: Negative.  Respiratory: Negative.  Cardiovascular: Negative.  Gastrointestinal: Negative.  Genitourinary:  Incontinence  Musculoskeletal: Negative.  Skin: Negative.  Neurological: Negative.  Endo/Heme/Allergies: Negative.  Psychiatric/Behavioral: Negative.    Medical History: Past Medical History:  Diagnosis Date  GERD (gastroesophageal reflux disease)  History of cancer   Patient Active Problem List  Diagnosis  Umbilical hernia without obstruction or gangrene  Left inguinal hernia   History reviewed. No pertinent surgical history.   No Known Allergies  Current Outpatient Medications on File Prior to Visit  Medication Sig Dispense Refill  aspirin 81 MG EC tablet Take 81 mg by mouth once daily   cholecalciferol (VITAMIN D3) 1000 unit tablet Take 1,000 Units by mouth once daily  lovastatin (MEVACOR) 40 MG tablet Take 40 mg by mouth once daily  pantoprazole (PROTONIX) 40 MG DR tablet Take 40 mg by mouth once daily   No current facility-administered medications on file prior to visit.   Family History  Problem Relation Age of Onset  Obesity Mother  Coronary Artery Disease (Blocked arteries around heart) Mother  Diabetes Mother  Obesity Brother  Diabetes Brother    Social History   Tobacco Use  Smoking Status Former  Types: Cigarettes  Smokeless Tobacco Never    Social History   Socioeconomic History  Marital status: Married  Tobacco Use  Smoking status: Former  Types: Cigarettes  Smokeless tobacco: Never  Substance and Sexual Activity  Alcohol use: Not Currently  Drug use: Never   Social Drivers of Corporate investment banker Strain: Low Risk (10/04/2022)  Received from [facility]  Overall Financial Resource Strain (CARDIA)  Difficulty of Paying Living Expenses: Not hard at all  Food Insecurity: No Food Insecurity (10/04/2022)  Received from [facility]  Hunger Vital Sign  Worried About Running Out of Food in the Last Year: Never true  Ran Out of Food in the Last Year: Never true  Transportation Needs: No Transportation Needs (10/04/2022)  Received from [facility] - Transportation  Lack of Transportation (Medical): No  Lack of Transportation (Non-Medical): No  Physical Activity: Sufficiently Active (10/04/2022)  Received from [facility]  Exercise Vital Sign  Days of Exercise per Week: 5 days  Minutes of Exercise per Session: 50 min  Stress: No Stress Concern Present (  10/04/2022)  Received from [facility] - Occupational Stress Questionnaire  Feeling of Stress : Not at all  Social Connections: Moderately Integrated (10/04/2022)  Received from [facility]  Social Connection and Isolation Panel  [NHANES]  Frequency of Communication with Friends and Family: More than three times a week  Frequency of Social Gatherings with Friends and Family: More than three times a week  Attends Religious Services: More than 4 times per year  Active Member of [employer] or Organizations: No  Attends Engineer, structural: Never  Marital Status: Married   Objective:   Vitals:  BP: (!) 163/78  Pulse: (!) 111  Temp: 36.1 C (97 F)  SpO2: 96%  Weight: 92.1 kg (203 lb)  Height: 177.8 cm (5\' 10" )   Body mass index is 29.13 kg/m.  Physical Exam   GENERAL APPEARANCE Comfortable, no acute issues Development: normal Gross deformities: none  SKIN Rash, lesions, ulcers: none Induration, erythema: none Nodules: none palpable  EYES Conjunctiva and lids: normal Pupils: equal  EARS, NOSE, MOUTH, THROAT External ears: no lesion or deformity External nose: no lesion or deformity Hearing: Significantly hard of hearing  CHEST/CV Not assessed  ABDOMEN Soft without distention. Obvious umbilical hernia. On palpation this measures approximately 5 cm across the hernia sac. It is partially reducible. Fascial defect measures approximately 3 cm in size.  GENITOURINARY/RECTAL Palpation in the right inguinal canal with cough and Valsalva shows no sign of hernia. Palpation in the left inguinal canal with cough and Valsalva shows a small left inguinal hernia which is spontaneously reducible.  MUSCULOSKELETAL Station and gait: normal Digits and nails: no clubbing or cyanosis Muscle strength: grossly normal all extremities Deformity: none  LYMPHATIC Cervical: none palpable Supraclavicular: none palpable  PSYCHIATRIC Oriented to person, place, and time: yes Mood and affect: normal for situation Judgment and insight: appropriate for situation   Assessment and Plan:   Umbilical hernia without obstruction or gangrene  Patient is referred by his primary care physician for surgical  evaluation and management of a mildly symptomatic umbilical hernia.  Patient is provided with written literature on hernia surgery to review at home.  Patient has a mildly symptomatic umbilical hernia which is increased in size over the past few years. He is now wearing an abdominal binder for comfort. He presents today accompanied by his wife to discuss surgical repair. We discussed outpatient surgery using a mesh patch for repair of the umbilical hernia. We discussed the size and location of the surgical incision. We discussed restrictions on his activities after the procedure. We discussed the risk of recurrence. The patient and his wife understand and wish to proceed with surgery in the near future.  Darnell Level, MD [facility] A [facility] practice Office: [phone]

## 2024-01-16 ENCOUNTER — Telehealth: Payer: Self-pay | Admitting: Cardiology

## 2024-01-16 ENCOUNTER — Encounter (HOSPITAL_COMMUNITY): Payer: Self-pay | Admitting: Physician Assistant

## 2024-01-16 NOTE — Telephone Encounter (Signed)
I s/w the pt and his wife who tell me that they did call back and confirm appt 01/18/24 with Ronie Spies, PAC.   I will update all parties involved.

## 2024-01-16 NOTE — Telephone Encounter (Signed)
   Pre-operative Risk Assessment    Patient Name: Daniel Rowland  DOB: 1935-08-18 MRN: [id number]      Request for Surgical Clearance    Procedure:  OPEN REPAIR UMBILICAL HERNIA WITH MESH [facility]   Date of Surgery:  Clearance 01/20/24                                 Surgeon:  Dr. Darnell Level  Surgeon's Group or Practice Name:  [facility]  Phone number:  [phone] Fax number:  [phone]    Type of Clearance Requested:   - Medical  - Pharmacy:  Hold Aspirin 5 day hold    Type of Anesthesia:  General    Additional requests/questions:    Signed, April Henson   01/16/2024, 10:14 AM

## 2024-01-16 NOTE — Telephone Encounter (Signed)
Left message for the pt to call back and confirm the appt we are holding for him 01/18/24 with Ronie Spies, PAC 11:20. See notes from [place], [facility].   Our office just received this request today fro procedure 01/20/24. I will send as FYI to surgeon office the pt needs in office appt and we have him scheduled 01/18/24, but pt needs to call back and confirm this appt we are holding for him.

## 2024-01-16 NOTE — Progress Notes (Unsigned)
Cardiology Office Note    Date:  01/18/2024  ID:  Christoph Copelan Corlew, DOB Oct 28, 1935, MRN [id number] PCP:  Corwin Levins, MD  Cardiologist:  Rollene Rotunda, MD  Electrophysiologist:  None   Chief Complaint: preop eval  History of Present Illness: .    Daniel Rowland is a 89 y.o. male with visit-pertinent history of coronary/aortic calcification, TIA, mild carotid plaque by duplex 2018, BPH, GERD, diverticulosis, umbilical hernia, hiatal hernia seen for pre-op evaluation. Remote echo 2007 EF 55% no significant abnormalities, apparently done in setting of TIA. Calcium score 09/2022 ordered by PCP was 2278, 81%ile, large hiatal hernia, aortic atherosclerosis, mild bronchial wall thickening. He saw Dr. Antoine Poche in 10/2022 for preop eval and dyspnea. ETT was recommended but never completed. The patient's wife was under the impression he didn't really need it. Lipids are managed by PCP.  He is seen for follow-up with his wife, prompted by preop request for hernia surgery. He had seen Dr. Gerrit Friends 12/08/23 for umbilical hernia that had increased in size over the past few years. Surgery was tentatively scheduled for 1/31. His wife reports she's noticed him becoming more SOB with exertion for the past 6 months. She isn't sure if perhaps this is related to being more sedentary the last few months. When he saw Dr. Antoine Poche in 2023 he had had some dyspnea with weed eating, but his wife had noticed it happening with activities like walking routine distances. He has not had any chest pain, diaphoresis, nausea, syncope or dizziness. He basically takes his medicine when he feels like it. Regarding anemia, he has not seen any s/sx of GIB or unusual bleeding. He may have scant urinary bleeding when self cathing but nothing significant recently.  Labwork independently reviewed: 12/2023 K 3.7, Cr 1.04, glucose 135, Hgb 12.0, plt 324 (previously 14 range) 04/2023 LDL 61, trig 79, TSH, LFTs ok by PCP  ROS: .    Please see  the history of present illness. He self-caths. All other systems are reviewed and otherwise negative.  Studies Reviewed: Marland Kitchen    EKG:  EKG is ordered today, personally reviewed, demonstrating NSR 73bpm no acute STT changes from prior. Benign.  CV Studies: Cardiac studies reviewed are outlined and summarized above. Otherwise please see EMR for full report.   Current Reported Medications:.    Current Meds  Medication Sig   aspirin EC 81 MG tablet Take 81 mg by mouth 3 (three) times a week. In the evening.   ibuprofen (ADVIL) 200 MG tablet Take 600 mg by mouth daily as needed (pain.).   lovastatin (MEVACOR) 40 MG tablet TAKE 1 TABLET BY MOUTH EVERY DAY (Patient taking differently: Take 40 mg by mouth 2 (two) times a week. In the evening.)   Multiple Vitamin (MULTIVITAMIN WITH MINERALS) TABS tablet Take 1 tablet by mouth once a week.   pantoprazole (PROTONIX) 40 MG tablet TAKE 1 TABLET BY MOUTH EVERY DAY (Patient taking differently: Take 40 mg by mouth daily.)    Physical Exam:    VS:  BP 132/70   Pulse 73   Ht 5\' 10"  (1.778 m)   Wt 200 lb 9.6 oz (91 kg)   SpO2 97%   BMI 28.78 kg/m    Wt Readings from Last 3 Encounters:  01/18/24 200 lb 9.6 oz (91 kg)  01/13/24 195 lb (88.5 kg)  09/30/23 198 lb (89.8 kg)    GEN: Well nourished, well developed in no acute distress NECK: No JVD; No carotid bruits  CARDIAC: RRR, no murmurs, rubs, gallops RESPIRATORY:  Clear to auscultation without rales, wheezing or rhonchi  ABDOMEN: Soft, non-tender, non-distended EXTREMITIES:  No edema; No acute deformity   Asessement and Plan:.    1. Preop cardiovascular evaluation, dyspnea on exertion - unfortunately given the DOE I think he needs further evaluation before he can be granted OK to proceed with surgery. I discussed clinical case with DOD Dr. Lynnette Caffey. He is concerned a nuclear stress test would not be sufficient and would recommend a cardiac PET for evaluation. Will also get an echocardiogram.  These tests will not be able to be completed and read by Friday therefore I advised that he contact his surgeon to let them know they will need to reschedule pending completion of his cardiac testing. He also needs f/u hemoglobin today given the drop in blood count from prior by preop labs. If still anemic would recommend PCP f/u as well. I will also route a copy of this note to the surgeon so they are aware. Will keep their team updated once his preop testing is complete.  Informed Consent   Shared Decision Making/Informed Consent The risks [chest pain, shortness of breath, cardiac arrhythmias, dizziness, blood pressure fluctuations, myocardial infarction, stroke/transient ischemic attack, nausea, vomiting, allergic reaction, radiation exposure, metallic taste sensation and life-threatening complications (estimated to be 1 in 10,000)], benefits (risk stratification, diagnosing coronary artery disease, treatment guidance) and alternatives of a cardiac PET stress test were discussed in detail with Mr. Mckibben and he agrees to proceed.     2. Coronary/aortic atherosclerosis - investigation underway as above. He is not routinely taking ASA, just when he feels like it. Lipids are managed by PCP, on lovastatin. Await H/H before resuming ASA.  3. Mild carotid artery plaque - no carotid bruit heard on exam. Will defer further surveillance decision to PCP given age. Lipids are managed by PCP.   4. Anemia by preop labs - ? Contribution to dyspnea - prior Hgb 14 range, down to 12.0. He denies any s/sx of bleeding. Recheck H/H today with anemia panel. Anticipate f/u PCP for this if still abnormal.    Disposition: F/u with me after completion of testing.  Signed, Laurann Montana, PA-C

## 2024-01-16 NOTE — Telephone Encounter (Signed)
   Name: Daniel Rowland  DOB: 1935/09/15  MRN: [id number]  Primary Cardiologist: Rollene Rotunda, MD  Chart reviewed as part of pre-operative protocol coverage. Because of [name] past medical history and time since last visit, he will require a follow-up in-office visit in order to better assess preoperative cardiovascular risk.  Pre-op covering staff: - Appt scheduled for 1/29 with Ronie Spies, PA-C at 11:20 am. Please call pt to make them aware of appt. - Please contact requesting surgeon's office via preferred method (i.e, phone, fax) to inform them of need for appointment prior to surgery.  The patient can hold ASA for 7 days.  Tereso Newcomer, PA-C  01/16/2024, 12:06 PM

## 2024-01-18 ENCOUNTER — Encounter: Payer: Self-pay | Admitting: Physician Assistant

## 2024-01-18 ENCOUNTER — Ambulatory Visit: Payer: Medicare HMO | Attending: Physician Assistant | Admitting: Physician Assistant

## 2024-01-18 VITALS — BP 132/70 | HR 73 | Ht 70.0 in | Wt 200.6 lb

## 2024-01-18 DIAGNOSIS — D649 Anemia, unspecified: Secondary | ICD-10-CM

## 2024-01-18 DIAGNOSIS — R931 Abnormal findings on diagnostic imaging of heart and coronary circulation: Secondary | ICD-10-CM | POA: Diagnosis not present

## 2024-01-18 DIAGNOSIS — Z01810 Encounter for preprocedural cardiovascular examination: Secondary | ICD-10-CM | POA: Diagnosis not present

## 2024-01-18 DIAGNOSIS — I700 Atherosclerosis of aorta: Secondary | ICD-10-CM

## 2024-01-18 DIAGNOSIS — R0609 Other forms of dyspnea: Secondary | ICD-10-CM

## 2024-01-18 DIAGNOSIS — I779 Disorder of arteries and arterioles, unspecified: Secondary | ICD-10-CM | POA: Diagnosis not present

## 2024-01-18 NOTE — Patient Instructions (Signed)
Medication Instructions:  Your physician recommends that you continue on your current medications as directed. Please refer to the Current Medication list given to you today.  *If you need a refill on your cardiac medications before your next appointment, please call your pharmacy*   Lab Work: Hemoglobin, hematocrit, anemia panel-TODAY If you have labs (blood work) drawn today and your tests are completely normal, you will receive your results only by: MyChart Message (if you have MyChart) OR A paper copy in the mail If you have any lab test that is abnormal or we need to change your treatment, we will call you to review the results.   Testing/Procedures: Your physician has requested that you have an echocardiogram. Echocardiography is a painless test that uses sound waves to create images of your heart. It provides your doctor with information about the size and shape of your heart and how well your heart's chambers and valves are working. This procedure takes approximately one hour. There are no restrictions for this procedure. Please do NOT wear cologne, perfume, aftershave, or lotions (deodorant is allowed). Please arrive 15 minutes prior to your appointment time.  Please note: We ask at that you not bring children with you during ultrasound (echo/ vascular) testing. Due to room size and safety concerns, children are not allowed in the ultrasound rooms during exams. Our front office staff cannot provide observation of children in our lobby area while testing is being conducted. An adult accompanying a patient to their appointment will only be allowed in the ultrasound room at the discretion of the ultrasound technician under special circumstances. We apologize for any inconvenience.   CARDIAC PET- Your physician has requested that you have a Cardiac Pet Stress Test. This testing is completed at [facility] ([address] [place], [place] [address]). The schedulers will  call you to get this scheduled. Please follow instructions below and call the office with any questions/concerns [phone]).      Please report to Radiology at the [facility] Main Entrance 30 minutes early for your test.  [address] [place], [place] [address]   How to Prepare for Your Cardiac PET/CT Stress Test:  Nothing to eat or drink, except water, 3 hours prior to arrival time.  NO caffeine/decaffeinated products, or chocolate 12 hours prior to arrival. (Please note decaffeinated beverages (teas/coffees) still contain caffeine).  If you have caffeine within 12 hours prior, the test will need to be rescheduled.  Medication instructions: Do not take erectile dysfunction medications for 72 hours prior to test (sildenafil, tadalafil) Do not take nitrates (isosorbide mononitrate, Ranexa) the day before or day of test Do not take tamsulosin the day before or morning of test Hold theophylline containing medications for 12 hours. Hold Dipyridamole 48 hours prior to the test.  Diabetic Preparation: If able to eat breakfast prior to 3 hour fasting, you may take all medications, including your insulin. Do not worry if you miss your breakfast dose of insulin - start at your next meal. If you do not eat prior to 3 hour fast-Hold all diabetes (oral and insulin) medications. Patients who wear a continuous glucose monitor MUST remove the device prior to scanning.  You may take your remaining medications with water.  NO perfume, cologne or lotion on chest or abdomen area. FEMALES - Please avoid wearing dresses to this appointment.  Total time is 1 to 2 hours; you may want to bring reading material for the waiting time.  IF YOU  THINK YOU MAY BE PREGNANT, OR ARE NURSING PLEASE INFORM THE TECHNOLOGIST.  In preparation for your appointment, medication and supplies will be purchased.  Appointment availability is limited, so if you need to cancel or reschedule, please call the  Radiology Department Scheduler at [phone] 24 hours in advance to avoid a cancellation fee of $100.00  What to Expect When you Arrive:  Once you arrive and check in for your appointment, you will be taken to a preparation room within the Radiology Department.  A technologist or Nurse will obtain your medical history, verify that you are correctly prepped for the exam, and explain the procedure.  Afterwards, an IV will be started in your arm and electrodes will be placed on your skin for EKG monitoring during the stress portion of the exam. Then you will be escorted to the PET/CT scanner.  There, staff will get you positioned on the scanner and obtain a blood pressure and EKG.  During the exam, you will continue to be connected to the EKG and blood pressure machines.  A small, safe amount of a radioactive tracer will be injected in your IV to obtain a series of pictures of your heart along with an injection of a stress agent.    After your Exam:  It is recommended that you eat a meal and drink a caffeinated beverage to counter act any effects of the stress agent.  Drink plenty of fluids for the remainder of the day and urinate frequently for the first couple of hours after the exam.  Your doctor will inform you of your test results within 7-10 business days.  For more information and frequently asked questions, please visit our website: [email or web address]  For questions about your test or how to prepare for your test, please call: Cardiac Imaging Nurse Navigators Office: [phone]   Follow-Up: At [facility], you and your health needs are our priority.  As part of our continuing mission to provide you with exceptional heart care, we have created designated Provider Care Teams.  These Care Teams include your primary Cardiologist (physician) and Advanced Practice Providers (APPs -  Physician Assistants and Nurse Practitioners) who all work together to provide you with  the care you need, when you need it.  We recommend signing up for the patient portal called "MyChart".  Sign up information is provided on this After Visit Summary.  MyChart is used to connect with patients for Virtual Visits (Telemedicine).  Patients are able to view lab/test results, encounter notes, upcoming appointments, etc.  Non-urgent messages can be sent to your provider as well.   To learn more about what you can do with MyChart, go to [email or web address].    Your next appointment:   3-4 week(s) after testing  Provider:   Ronie Spies, PA-C

## 2024-01-19 ENCOUNTER — Other Ambulatory Visit: Payer: Self-pay

## 2024-01-19 ENCOUNTER — Other Ambulatory Visit: Payer: Self-pay | Admitting: Internal Medicine

## 2024-01-20 ENCOUNTER — Ambulatory Visit (HOSPITAL_COMMUNITY): Admission: RE | Admit: 2024-01-20 | Payer: Medicare HMO | Source: Home / Self Care | Admitting: Surgery

## 2024-01-20 ENCOUNTER — Encounter (HOSPITAL_COMMUNITY): Admission: RE | Payer: Self-pay | Source: Home / Self Care

## 2024-01-20 DIAGNOSIS — K429 Umbilical hernia without obstruction or gangrene: Secondary | ICD-10-CM | POA: Diagnosis present

## 2024-01-20 SURGERY — HERNIA REPAIR UMBILICAL ADULT
Anesthesia: General

## 2024-01-23 DIAGNOSIS — C61 Malignant neoplasm of prostate: Secondary | ICD-10-CM | POA: Diagnosis not present

## 2024-01-23 DIAGNOSIS — M75122 Complete rotator cuff tear or rupture of left shoulder, not specified as traumatic: Secondary | ICD-10-CM | POA: Diagnosis not present

## 2024-01-23 DIAGNOSIS — R339 Retention of urine, unspecified: Secondary | ICD-10-CM | POA: Diagnosis not present

## 2024-01-23 DIAGNOSIS — Z8546 Personal history of malignant neoplasm of prostate: Secondary | ICD-10-CM | POA: Diagnosis not present

## 2024-01-25 ENCOUNTER — Telehealth: Payer: Self-pay

## 2024-01-25 ENCOUNTER — Other Ambulatory Visit: Payer: Self-pay

## 2024-01-25 LAB — ANEMIA PANEL
Ferritin: 17 ng/mL — ABNORMAL LOW (ref 30–400)
Folate, Hemolysate: 418 ng/mL
Folate, RBC: 1083 ng/mL (ref >498)
Hematocrit: 38.6 % (ref 37.5–51.0)
Iron Saturation: 7 % — CL (ref 15–55)
Iron: 24 ug/dL — ABNORMAL LOW (ref 38–169)
Retic Ct Pct: 1.2 % (ref 0.6–2.6)
Total Iron Binding Capacity: 350 ug/dL (ref 250–450)
UIBC: 326 ug/dL (ref 111–343)
Vitamin B-12: 478 pg/mL (ref 232–1245)

## 2024-01-25 LAB — HEMOGLOBIN: Hemoglobin: 12.6 g/dL — ABNORMAL LOW (ref 13.0–17.7)

## 2024-01-25 NOTE — Telephone Encounter (Signed)
 Spoke with pt's wife after receiving verbal consent from pt as he cannot hear over the phone. Pt's wife was notified of his mild anemia and evidence of severe iron  deficiency. Pt's wife stated that pt had stopped taking his vitamins but that he was taking them again regularly. Pt's wife was told to have pt hold off on restarting ASA until evaluated. Pt's wife was told that pt's PCP would be notified of these results and that they should evaluate/manage this issue as he has a pending hernia surgery in the near future. The office of pt's PCP, Lynwood ORN. Mccaig, MD, was called about lab results and it was requested that they follow up with the pt.

## 2024-01-25 NOTE — Telephone Encounter (Signed)
-----   Message from Dayna N Dunn sent at 01/19/2024  8:21 AM EST ----- Please let pt know that some of his labs are still pending but his labs demonstrate continued mild anemia with evidence of severe iron  deficiency. Per literature review, given that he is pending hernia surgery in the near future, may benefit from IV iron  but this is something that needs to be evaluated/managed by his PCP. Can you contact patient's PCP to ask them to please help us  arrange ASAP follow-up for the patient to help navigate this? Prior Hgb was 14-15, now in the 12's. Also needs medical workup as to why he is so iron  deficient as well. Hold off on restarting ASA until this is evaluated. Otherwise keep cardiac plan as discussed.

## 2024-02-01 ENCOUNTER — Ambulatory Visit (INDEPENDENT_AMBULATORY_CARE_PROVIDER_SITE_OTHER): Payer: Medicare HMO | Admitting: Internal Medicine

## 2024-02-01 ENCOUNTER — Encounter: Payer: Self-pay | Admitting: Internal Medicine

## 2024-02-01 VITALS — BP 160/82 | HR 75 | Temp 97.7°F | Ht 70.0 in | Wt 198.0 lb

## 2024-02-01 DIAGNOSIS — Z01818 Encounter for other preprocedural examination: Secondary | ICD-10-CM | POA: Diagnosis not present

## 2024-02-01 DIAGNOSIS — R931 Abnormal findings on diagnostic imaging of heart and coronary circulation: Secondary | ICD-10-CM

## 2024-02-01 DIAGNOSIS — K429 Umbilical hernia without obstruction or gangrene: Secondary | ICD-10-CM

## 2024-02-01 DIAGNOSIS — D509 Iron deficiency anemia, unspecified: Secondary | ICD-10-CM

## 2024-02-01 MED ORDER — PANTOPRAZOLE SODIUM 40 MG PO TBEC: 40.0000 mg | DELAYED_RELEASE_TABLET | Freq: Every day | ORAL | 3 refills | Status: AC

## 2024-02-01 MED ORDER — POLYSACCHARIDE IRON COMPLEX 150 MG PO CAPS: 150.0000 mg | ORAL_CAPSULE | Freq: Every day | ORAL | 1 refills | Status: DC

## 2024-02-01 NOTE — Patient Instructions (Signed)
Please to restart the Protonix 40 mg per day  Please take all new medication as prescribed  - the iron supplement  Please continue all other medications as before, and refills have been done if requested.  Please have the pharmacy call with any other refills you may need.  Please keep your appointments with your specialists as you may have planned  You will be contacted regarding the referral for: Gastroenterology  Please repeat blood testing in 4 weeks  You are cleared for surgury from mypoint of view, but you would still need to be cleared per Cardiology as well
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
## 2024-02-04 ENCOUNTER — Encounter: Payer: Self-pay | Admitting: Internal Medicine

## 2024-02-04 DIAGNOSIS — D509 Iron deficiency anemia, unspecified: Secondary | ICD-10-CM | POA: Insufficient documentation

## 2024-02-04 DIAGNOSIS — Z01818 Encounter for other preprocedural examination: Secondary | ICD-10-CM | POA: Insufficient documentation

## 2024-02-04 NOTE — Assessment & Plan Note (Signed)
Remains mild intermittent symptomatic, will need repair when able

## 2024-02-04 NOTE — Assessment & Plan Note (Signed)
Lab Results  Component Value Date   WBC 6.6 01/13/2024   HGB 12.6 (L) 01/18/2024   HCT 38.6 01/18/2024   MCV 90.5 01/13/2024   PLT 324 01/13/2024  Mild, for restart protonix 40 every day, iron supplement asd, refer GI likely needs EGD, repeat iron/cbc in 4 wks

## 2024-02-04 NOTE — Assessment & Plan Note (Signed)
Ok for [employer] pending cardiology approval as well

## 2024-02-04 NOTE — Assessment & Plan Note (Signed)
Pt has been ordered PET stress,  to f/u any worsening symptoms or concerns

## 2024-02-06 ENCOUNTER — Ambulatory Visit (HOSPITAL_COMMUNITY): Payer: Medicare HMO | Attending: Physician Assistant

## 2024-02-06 DIAGNOSIS — Z01810 Encounter for preprocedural cardiovascular examination: Secondary | ICD-10-CM | POA: Diagnosis not present

## 2024-02-06 DIAGNOSIS — R0609 Other forms of dyspnea: Secondary | ICD-10-CM | POA: Diagnosis not present

## 2024-02-06 LAB — ECHOCARDIOGRAM COMPLETE
Area-P 1/2: 2.78 cm2
S' Lateral: 2.7 cm

## 2024-02-08 ENCOUNTER — Ambulatory Visit (HOSPITAL_COMMUNITY): Payer: Medicare HMO

## 2024-02-13 ENCOUNTER — Other Ambulatory Visit (HOSPITAL_COMMUNITY): Payer: Medicare HMO

## 2024-02-14 ENCOUNTER — Telehealth (HOSPITAL_COMMUNITY): Payer: Self-pay | Admitting: Emergency Medicine

## 2024-02-14 NOTE — Telephone Encounter (Signed)
 Reaching out to patient to offer assistance regarding upcoming cardiac imaging study; pt verbalizes understanding of appt date/time, parking situation and where to check in, pre-test NPO status and medications ordered, and verified current allergies; name and call back number provided for further questions should they arise Rockwell Alexandria RN Navigator Cardiac Imaging [facility] [phone] office [phone] cell

## 2024-02-14 NOTE — Telephone Encounter (Signed)
 I will direct this to the preop APP for further recommendations.

## 2024-02-14 NOTE — Telephone Encounter (Signed)
 Pt spouse called in about surgery. She states PET is scheduled for 04/03/24 but they would like for him to be cleared for surgery prior to that. She asked if he can have a different type of stress test that would be quick or any other options. Please advise.

## 2024-02-14 NOTE — Telephone Encounter (Signed)
 I will forward notes to surgeon office as well per preop APP Robin Searing, NP:  Gaston Rowland., NP  You8 minutes ago (10:30 AM)    Per office note and advisement of DOD Dr. Lynnette Caffey, a nuclear stress test would not be sufficient enough to evaluate patient's surgical risk.  Therefore a PET stress test was ordered for further evaluation and patient was advised to delay surgery until completion.  Please let me know if have any further questions.    Please see all notes

## 2024-02-15 ENCOUNTER — Ambulatory Visit (HOSPITAL_COMMUNITY)
Admission: RE | Admit: 2024-02-15 | Discharge: 2024-02-15 | Disposition: A | Discharge status: Home / Self Care | Payer: Medicare HMO | Source: Ambulatory Visit | Attending: Physician Assistant | Admitting: Physician Assistant

## 2024-02-15 DIAGNOSIS — R0609 Other forms of dyspnea: Secondary | ICD-10-CM | POA: Diagnosis not present

## 2024-02-15 DIAGNOSIS — Z01810 Encounter for preprocedural cardiovascular examination: Secondary | ICD-10-CM | POA: Diagnosis not present

## 2024-02-15 LAB — NM PET CT CARDIAC PERFUSION MULTI W/ABSOLUTE BLOODFLOW
LV dias vol: 116 mL (ref 62–150)
LV sys vol: 62 mL
MBFR: 1.04
Nuc Rest EF: 55 %
Nuc Stress EF: 47 %
Peak HR: 78 {beats}/min
Rest HR: 68 {beats}/min
Rest MBF: 0.77 ml/g/min
Rest Nuclear Isotope Dose: 23.3 mCi
ST Depression (mm): 0 mm
Stress MBF: 0.8 ml/g/min
Stress Nuclear Isotope Dose: 23.5 mCi
TID: 1.19

## 2024-02-15 MED ORDER — RUBIDIUM RB82 GENERATOR (RUBYFILL)
23.4600 | PACK | Freq: Once | INTRAVENOUS | Status: AC
  Administered 2024-02-15: 23.46 via INTRAVENOUS

## 2024-02-15 MED ORDER — REGADENOSON 0.4 MG/5ML IV SOLN
INTRAVENOUS | Status: AC
  Filled 2024-02-15: qty 5

## 2024-02-15 MED ORDER — REGADENOSON 0.4 MG/5ML IV SOLN
0.4000 mg | Freq: Once | INTRAVENOUS | Status: AC
  Administered 2024-02-15: 0.4 mg via INTRAVENOUS

## 2024-02-15 MED ORDER — RUBIDIUM RB82 GENERATOR (RUBYFILL)
23.2600 | PACK | Freq: Once | INTRAVENOUS | Status: AC
  Administered 2024-02-15: 23.26 via INTRAVENOUS

## 2024-02-16 ENCOUNTER — Telehealth: Payer: Self-pay | Admitting: Internal Medicine

## 2024-02-16 DIAGNOSIS — D509 Iron deficiency anemia, unspecified: Secondary | ICD-10-CM

## 2024-02-16 NOTE — Telephone Encounter (Signed)
 Please to contact pt - to check hemocult stool - test ordered, pt to come to lab please

## 2024-02-16 NOTE — Telephone Encounter (Signed)
 Called and left voice mail
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
## 2024-02-20 ENCOUNTER — Encounter: Payer: Self-pay | Admitting: Cardiology

## 2024-02-20 ENCOUNTER — Other Ambulatory Visit: Payer: Self-pay | Admitting: *Deleted

## 2024-02-20 ENCOUNTER — Ambulatory Visit: Payer: Medicare HMO | Attending: Cardiology | Admitting: Cardiology

## 2024-02-20 VITALS — BP 146/72 | HR 84 | Ht 70.0 in | Wt 201.4 lb

## 2024-02-20 DIAGNOSIS — I779 Disorder of arteries and arterioles, unspecified: Secondary | ICD-10-CM

## 2024-02-20 DIAGNOSIS — I10 Essential (primary) hypertension: Secondary | ICD-10-CM

## 2024-02-20 DIAGNOSIS — R931 Abnormal findings on diagnostic imaging of heart and coronary circulation: Secondary | ICD-10-CM

## 2024-02-20 DIAGNOSIS — Z01810 Encounter for preprocedural cardiovascular examination: Secondary | ICD-10-CM | POA: Diagnosis not present

## 2024-02-20 DIAGNOSIS — E7800 Pure hypercholesterolemia, unspecified: Secondary | ICD-10-CM

## 2024-02-20 DIAGNOSIS — R0609 Other forms of dyspnea: Secondary | ICD-10-CM | POA: Diagnosis not present

## 2024-02-20 DIAGNOSIS — R0602 Shortness of breath: Secondary | ICD-10-CM

## 2024-02-20 LAB — CBC

## 2024-02-20 NOTE — Patient Instructions (Addendum)
 Medication Instructions:   NO CHANGE  *If you need a refill on your cardiac medications before your next appointment, please call your pharmacy*   Lab Work:  Your physician recommends that you HAVE LAB WORK TODAY  If you have labs (blood work) drawn today and your tests are completely normal, you will receive your results only by: MyChart Message (if you have MyChart) OR A paper copy in the mail If you have any lab test that is abnormal or we need to change your treatment, we will call you to review the results.   Testing/Procedures:  [facility] A DEPT OF [place]. [facility] AT [facility] [address] [place] [address] [place] [address] Dept: [phone] Loc: [phone]  Daniel Rowland  02/20/2024  You are scheduled for a Cardiac Catheterization on Friday, March 7 with Dr. Bryan Lemma.  1. Please arrive at the [facility] (Main Entrance A) at [facility]: [address] [place], [place] [address] at 8:30 AM (This time is 2 hour(s) before your procedure to ensure your preparation).   Free valet parking service is available. You will check in at ADMITTING. The support person will be asked to wait in the waiting room.  It is OK to have someone drop you off and come back when you are ready to be discharged.    Special note: Every effort is made to have your procedure done on time. Please understand that emergencies sometimes delay scheduled procedures.  2. Diet: Do not eat solid foods after midnight.  The patient may have clear liquids until 5am upon the day of the procedure.  3. Labs: You will need to have blood drawn after Office Visit on 02/20/2024 (CBC and BMET) ,     4. Medication instructions in preparation for your procedure:   Contrast Allergy: No   On the morning of your procedure, take your Aspirin 81 mg and any morning medicines NOT listed above.  You may use sips of water.  5. Plan to go home the same  day, you will only stay overnight if medically necessary. 6. Bring a current list of your medications and current insurance cards. 7. You MUST have a responsible person to drive you home. 8. Someone MUST be with you the first 24 hours after you arrive home or your discharge will be delayed. 9. Please wear clothes that are easy to get on and off and wear slip-on shoes.  Thank you for allowing [place] to care for you!   -- [facility]    Follow-Up: At [facility], you and your health needs are our priority.  As part of our continuing mission to provide you with exceptional heart care, we have created designated Provider Care Teams.  These Care Teams include your primary Cardiologist (physician) and Advanced Practice Providers (APPs -  Physician Assistants and Nurse Practitioners) who all work together to provide you with the care you need, when you need it.    Your next appointment:   4 week(s)  Provider:   Rollene Rotunda, MD   or APP  (Physician Assistants and Nurse Practitioners)

## 2024-02-21 ENCOUNTER — Encounter: Payer: Self-pay | Admitting: *Deleted

## 2024-02-21 LAB — CBC
Hematocrit: 40.5 % (ref 37.5–51.0)
Hemoglobin: 12.6 g/dL — ABNORMAL LOW (ref 13.0–17.7)
MCH: 27.6 pg (ref 26.6–33.0)
MCHC: 31.1 g/dL — ABNORMAL LOW (ref 31.5–35.7)
MCV: 89 fL (ref 79–97)
Platelets: 325 10*3/uL (ref 150–450)
RBC: 4.56 x10E6/uL (ref 4.14–5.80)
RDW: 13.7 % (ref 11.6–15.4)
WBC: 5.7 10*3/uL (ref 3.4–10.8)

## 2024-02-21 LAB — BASIC METABOLIC PANEL
BUN/Creatinine Ratio: 12 (ref 10–24)
BUN: 14 mg/dL (ref 8–27)
CO2: 23 mmol/L (ref 20–29)
Calcium: 9.3 mg/dL (ref 8.6–10.2)
Chloride: 107 mmol/L — ABNORMAL HIGH (ref 96–106)
Creatinine, Ser: 1.17 mg/dL (ref 0.76–1.27)
Glucose: 124 mg/dL — ABNORMAL HIGH (ref 70–99)
Potassium: 4.9 mmol/L (ref 3.5–5.2)
Sodium: 140 mmol/L (ref 134–144)
eGFR: 61 mL/min/{1.73_m2} (ref >59)

## 2024-02-22 ENCOUNTER — Telehealth: Payer: Self-pay | Admitting: *Deleted

## 2024-02-22 NOTE — Telephone Encounter (Signed)
 Cardiac Catheterization scheduled at [facility] for: Friday February 24, 2024 10:30 AM Arrival time [facility] Main Entrance A at: 8:30 AM  Nothing to eat after midnight prior to procedure, clear liquids until 5 AM day of procedure.  Medication instructions: -Usual morning medications can be taken with sips of water including aspirin 81 mg.  Plan to go home the same day, you will only stay overnight if medically necessary.  You must have responsible adult to drive you home.  Someone must be with you the first 24 hours after you arrive home.  Reviewed procedure instructions with patient's wife (DPR), Alona Bene.

## 2024-02-23 DIAGNOSIS — C61 Malignant neoplasm of prostate: Secondary | ICD-10-CM | POA: Diagnosis not present

## 2024-02-23 DIAGNOSIS — R339 Retention of urine, unspecified: Secondary | ICD-10-CM | POA: Diagnosis not present

## 2024-02-23 DIAGNOSIS — Z8546 Personal history of malignant neoplasm of prostate: Secondary | ICD-10-CM | POA: Diagnosis not present

## 2024-02-24 ENCOUNTER — Ambulatory Visit (HOSPITAL_COMMUNITY)
Admission: RE | Admit: 2024-02-24 | Discharge: 2024-02-24 | Disposition: A | Discharge status: Home / Self Care | Attending: Cardiology | Admitting: Cardiology

## 2024-02-24 ENCOUNTER — Other Ambulatory Visit: Payer: Self-pay

## 2024-02-24 ENCOUNTER — Encounter (HOSPITAL_COMMUNITY)
Admission: RE | Disposition: A | Discharge status: Home / Self Care | Payer: Self-pay | Source: Home / Self Care | Attending: Cardiology

## 2024-02-24 DIAGNOSIS — R931 Abnormal findings on diagnostic imaging of heart and coronary circulation: Secondary | ICD-10-CM

## 2024-02-24 DIAGNOSIS — Z7982 Long term (current) use of aspirin: Secondary | ICD-10-CM | POA: Diagnosis not present

## 2024-02-24 DIAGNOSIS — I779 Disorder of arteries and arterioles, unspecified: Secondary | ICD-10-CM

## 2024-02-24 DIAGNOSIS — Z8249 Family history of ischemic heart disease and other diseases of the circulatory system: Secondary | ICD-10-CM | POA: Insufficient documentation

## 2024-02-24 DIAGNOSIS — E785 Hyperlipidemia, unspecified: Secondary | ICD-10-CM | POA: Insufficient documentation

## 2024-02-24 DIAGNOSIS — I2584 Coronary atherosclerosis due to calcified coronary lesion: Secondary | ICD-10-CM | POA: Diagnosis not present

## 2024-02-24 DIAGNOSIS — I2582 Chronic total occlusion of coronary artery: Secondary | ICD-10-CM | POA: Insufficient documentation

## 2024-02-24 DIAGNOSIS — R9439 Abnormal result of other cardiovascular function study: Secondary | ICD-10-CM | POA: Insufficient documentation

## 2024-02-24 DIAGNOSIS — I2510 Atherosclerotic heart disease of native coronary artery without angina pectoris: Secondary | ICD-10-CM | POA: Diagnosis not present

## 2024-02-24 DIAGNOSIS — Z87891 Personal history of nicotine dependence: Secondary | ICD-10-CM | POA: Diagnosis not present

## 2024-02-24 DIAGNOSIS — I10 Essential (primary) hypertension: Secondary | ICD-10-CM | POA: Insufficient documentation

## 2024-02-24 DIAGNOSIS — Z79899 Other long term (current) drug therapy: Secondary | ICD-10-CM | POA: Diagnosis not present

## 2024-02-24 HISTORY — PX: LEFT HEART CATH AND CORONARY ANGIOGRAPHY: CATH118249

## 2024-02-24 SURGERY — LEFT HEART CATH AND CORONARY ANGIOGRAPHY
Anesthesia: LOCAL

## 2024-02-24 MED ORDER — ACETAMINOPHEN 325 MG PO TABS: 650.0000 mg | ORAL_TABLET | ORAL | Status: DC | PRN

## 2024-02-24 MED ORDER — LIDOCAINE HCL (PF) 1 % IJ SOLN
INTRAMUSCULAR | Status: DC | PRN
  Administered 2024-02-24: 2 mL via INTRADERMAL

## 2024-02-24 MED ORDER — VERAPAMIL HCL 2.5 MG/ML IV SOLN
INTRAVENOUS | Status: DC | PRN
  Administered 2024-02-24: 10 mL via INTRA_ARTERIAL

## 2024-02-24 MED ORDER — SODIUM CHLORIDE 0.9% FLUSH: 3.0000 mL | INTRAVENOUS | Status: DC | PRN

## 2024-02-24 MED ORDER — SODIUM CHLORIDE 0.9% FLUSH: 3.0000 mL | Freq: Two times a day (BID) | INTRAVENOUS | Status: DC

## 2024-02-24 MED ORDER — SODIUM CHLORIDE 0.9 % IV SOLN: 250.0000 mL | INTRAVENOUS | Status: DC | PRN

## 2024-02-24 MED ORDER — HEPARIN (PORCINE) IN NACL 1000-0.9 UT/500ML-% IV SOLN
INTRAVENOUS | Status: DC | PRN
  Administered 2024-02-24 (×2): 500 mL

## 2024-02-24 MED ORDER — MIDAZOLAM HCL 2 MG/2ML IJ SOLN
INTRAMUSCULAR | Status: DC | PRN
  Administered 2024-02-24: 1 mg via INTRAVENOUS

## 2024-02-24 MED ORDER — HEPARIN SODIUM (PORCINE) 1000 UNIT/ML IJ SOLN
INTRAMUSCULAR | Status: DC | PRN
  Administered 2024-02-24: 4500 [IU] via INTRAVENOUS

## 2024-02-24 MED ORDER — SODIUM CHLORIDE 0.9 % WEIGHT BASED INFUSION: 1.0000 mL/kg/h | INTRAVENOUS | Status: DC

## 2024-02-24 MED ORDER — ONDANSETRON HCL 4 MG/2ML IJ SOLN
4.0000 mg | Freq: Four times a day (QID) | INTRAMUSCULAR | Status: DC | PRN
Start: 2024-02-24 — End: 2024-02-24

## 2024-02-24 MED ORDER — MIDAZOLAM HCL 2 MG/2ML IJ SOLN
INTRAMUSCULAR | Status: AC
  Filled 2024-02-24: qty 2

## 2024-02-24 MED ORDER — HYDRALAZINE HCL 20 MG/ML IJ SOLN: 10.0000 mg | INTRAMUSCULAR | Status: DC | PRN

## 2024-02-24 MED ORDER — IOHEXOL 350 MG/ML SOLN
INTRAVENOUS | Status: DC | PRN
  Administered 2024-02-24: 50 mL

## 2024-02-24 MED ORDER — LIDOCAINE HCL (PF) 1 % IJ SOLN
INTRAMUSCULAR | Status: AC
  Filled 2024-02-24: qty 30

## 2024-02-24 MED ORDER — HEPARIN SODIUM (PORCINE) 1000 UNIT/ML IJ SOLN
INTRAMUSCULAR | Status: AC
  Filled 2024-02-24: qty 10

## 2024-02-24 MED ORDER — LABETALOL HCL 5 MG/ML IV SOLN: 10.0000 mg | INTRAVENOUS | Status: DC | PRN

## 2024-02-24 MED ORDER — FENTANYL CITRATE (PF) 100 MCG/2ML IJ SOLN
INTRAMUSCULAR | Status: AC
  Filled 2024-02-24: qty 2

## 2024-02-24 MED ORDER — ASPIRIN 81 MG PO CHEW
81.0000 mg | CHEWABLE_TABLET | ORAL | Status: DC
Start: 2024-02-25 — End: 2024-02-24

## 2024-02-24 MED ORDER — VERAPAMIL HCL 2.5 MG/ML IV SOLN
INTRAVENOUS | Status: AC
  Filled 2024-02-24: qty 2

## 2024-02-24 MED ORDER — SODIUM CHLORIDE 0.9 % WEIGHT BASED INFUSION
3.0000 mL/kg/h | INTRAVENOUS | Status: AC
Start: 2024-02-24 — End: 2024-02-24

## 2024-02-24 MED ORDER — FENTANYL CITRATE (PF) 100 MCG/2ML IJ SOLN
INTRAMUSCULAR | Status: DC | PRN
  Administered 2024-02-24: 25 ug via INTRAVENOUS

## 2024-02-24 SURGICAL SUPPLY — 8 items
CATH INFINITI AMBI 5FR TG (CATHETERS) IMPLANT
DEVICE RAD COMP TR BAND LRG (VASCULAR PRODUCTS) IMPLANT
GLIDESHEATH SLEND SS 6F .021 (SHEATH) IMPLANT
GUIDEWIRE INQWIRE 1.5J.035X260 (WIRE) IMPLANT
INQWIRE 1.5J .035X260CM (WIRE) ×1 IMPLANT
PACK CARDIAC CATHETERIZATION (CUSTOM PROCEDURE TRAY) ×1 IMPLANT
SET ATX-X65L (MISCELLANEOUS) IMPLANT
SHEATH PROBE COVER 6X72 (BAG) IMPLANT

## 2024-02-24 NOTE — Progress Notes (Signed)
 TR band removed at 1450, gauze dressing applied. Right radial level 0, clean, dry, and intact. Patient walked to the bathroom without difficulties.

## 2024-02-24 NOTE — Discharge Instructions (Signed)

## 2024-02-24 NOTE — Interval H&P Note (Signed)
 History and Physical Interval Note:  02/24/2024 12:27 PM  Daniel Rowland  has presented today for surgery, with the diagnosis of Abnormal Cardiac Stress Pet Scan.  The various methods of treatment have been discussed with the patient and family. After consideration of risks, benefits and other options for treatment, the patient has consented to  Procedure(s): LEFT HEART CATH AND CORONARY ANGIOGRAPHY (N/A) PERCUTANEOUS CORONARY INTERVENTION   as a surgical intervention.  The patient's history has been reviewed, patient examined, no change in status, stable for surgery.  I have reviewed the patient's chart and labs.  Questions were answered to the patient's satisfaction.     Bryan Lemma

## 2024-02-24 NOTE — Interval H&P Note (Signed)
 Cath Lab Visit (complete for each Cath Lab visit)  Clinical Evaluation Leading to the Procedure:   ACS: No.  Non-ACS:    Anginal Classification: CCS II most notable symptom is exertional fatigue  Anti-ischemic medical therapy: Minimal Therapy (1 class of medications)  Non-Invasive Test Results: Intermediate-risk stress test findings: cardiac mortality 1-3%/year  Prior CABG: No previous CABG   Daniel Rowland

## 2024-02-26 ENCOUNTER — Encounter (HOSPITAL_COMMUNITY): Payer: Self-pay | Admitting: Cardiology

## 2024-02-28 ENCOUNTER — Ambulatory Visit: Payer: Medicare HMO | Admitting: Emergency Medicine

## 2024-02-29 ENCOUNTER — Ambulatory Visit: Payer: Medicare HMO | Admitting: Internal Medicine

## 2024-02-29 ENCOUNTER — Encounter: Payer: Self-pay | Admitting: Internal Medicine

## 2024-02-29 VITALS — BP 110/64 | HR 76 | Temp 98.9°F | Ht 70.0 in | Wt 198.0 lb

## 2024-02-29 DIAGNOSIS — E559 Vitamin D deficiency, unspecified: Secondary | ICD-10-CM | POA: Diagnosis not present

## 2024-02-29 DIAGNOSIS — R7303 Prediabetes: Secondary | ICD-10-CM | POA: Diagnosis not present

## 2024-02-29 DIAGNOSIS — D509 Iron deficiency anemia, unspecified: Secondary | ICD-10-CM | POA: Diagnosis not present

## 2024-02-29 DIAGNOSIS — I10 Essential (primary) hypertension: Secondary | ICD-10-CM

## 2024-02-29 DIAGNOSIS — K5900 Constipation, unspecified: Secondary | ICD-10-CM | POA: Diagnosis not present

## 2024-02-29 LAB — CBC WITH DIFFERENTIAL/PLATELET
Basophils Absolute: 0.1 10*3/uL (ref 0.0–0.1)
Basophils Relative: 0.8 % (ref 0.0–3.0)
Eosinophils Absolute: 0.1 10*3/uL (ref 0.0–0.7)
Eosinophils Relative: 1 % (ref 0.0–5.0)
HCT: 39.2 % (ref 39.0–52.0)
Hemoglobin: 12.5 g/dL — ABNORMAL LOW (ref 13.0–17.0)
Lymphocytes Relative: 17.6 % (ref 12.0–46.0)
Lymphs Abs: 1.5 10*3/uL (ref 0.7–4.0)
MCHC: 32 g/dL (ref 30.0–36.0)
MCV: 86.4 fl (ref 78.0–100.0)
Monocytes Absolute: 1.2 10*3/uL — ABNORMAL HIGH (ref 0.1–1.0)
Monocytes Relative: 13.4 % — ABNORMAL HIGH (ref 3.0–12.0)
Neutro Abs: 5.8 10*3/uL (ref 1.4–7.7)
Neutrophils Relative %: 67.2 % (ref 43.0–77.0)
Platelets: 317 10*3/uL (ref 150.0–400.0)
RBC: 4.54 Mil/uL (ref 4.22–5.81)
RDW: 16.3 % — ABNORMAL HIGH (ref 11.5–15.5)
WBC: 8.6 10*3/uL (ref 4.0–10.5)

## 2024-02-29 LAB — IBC PANEL
Iron: 126 ug/dL (ref 42–165)
Saturation Ratios: 34.7 % (ref 20.0–50.0)
TIBC: 362.6 ug/dL (ref 250.0–450.0)
Transferrin: 259 mg/dL (ref 212.0–360.0)

## 2024-02-29 LAB — FERRITIN: Ferritin: 15.8 ng/mL — ABNORMAL LOW (ref 22.0–322.0)
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
## 2024-02-29 NOTE — Patient Instructions (Signed)
 Please continue all other medications as before, including the 1 per day iron  Please have the pharmacy call with any other refills you may need.  Please continue your efforts at being more active, low cholesterol diet, and weight control.  Please keep your appointments with your specialists as you may have planned  Please go to the LAB at the blood drawing area for the tests to be done  You will be contacted by phone if any changes need to be made immediately.  Otherwise, you will receive a letter about your results with an explanation, but please check with MyChart first.  Please make an Appointment to return in 4 months, or sooner if needed

## 2024-03-01 ENCOUNTER — Encounter: Payer: Self-pay | Admitting: Internal Medicine

## 2024-03-02 ENCOUNTER — Ambulatory Visit: Payer: Self-pay | Admitting: Emergency Medicine

## 2024-03-02 NOTE — Heart Team MDD (Addendum)
 Heart Team Multi-Disciplinary Discussion  Patient: Daniel Rowland  DOB: [birth year]  MRN: [id number]   Date: 03/02/2024  12:53 PM    Attendees: Interventional Cardiology: Lorine Bears, MD Shawnie Pons, MD Alverda Skeans, MD Bryan Lemma, MD Yates Decamp, MD Truett Mainland, MD Peter Swaz[place], MD Yvonne Kendall, MD Dorothyann Peng, MD  Cardiothoracic Surgery: Brynda Greathouse, MD   Additional Attendees: [facility], [place]   Patient History: Pt with c/o exertional dyspnea when climbing stairs, chest tightness with activity. Calcium score October 2023 2278.  Patient was scheduled to have hernia surgery and was seen in the office for preoperative evaluation recently.  Echocardiogram February 2025 showed normal LV function.  Stress PET February 2025 showed inferior ischemia and small area of inferior apical infarction.  Study interpreted as high risk due to perfusion defect, transient ischemic dilatation and ejection fraction decreasing with stress (55% to 47%); myocardial blood flow reserve 1.04 also abnormal.  Severe coronary calcifications also noted.         Review of Prior Angiography and PCI Procedures: The left heart cath and coronary angiography from 02/24/2024 images were reviewed and discussed in detail including: Severe three-vessel disease, heavily calcified proximal arteries: RCA moderate disease proximally and then on a percent CTO in the proximal segment after RVM branch. Left-to-right collaterals from the AV groove branch and septal perforators provide faint collaterals to the PL system and PDA. Calcified left main with no significant disease. Heavily calcified ostial and proximal LAD tandem 80% and 90% stenoses involving SP1 and diminutive D1; the major D2 has ostial 70% stenosis and proximal 50% stenosis beyond D2 and SP2 there is a 90% eccentric calcified lesion in the LAD with a 70% stenosis after D3, the LAD is in the 100% totally occluded after SP 3 (just  beyond D3) -> the mid to apical LAD fills via left-to-right collaterals and appears to be roughly 1 mm in diameter. Large caliber LCx has a calcified 1, 1, 1 bifurcation lesion involving LCx-OM1, then shortly after the AV groove branch there is another eccentric 90% calcified lesion just proximal to OM 2 that itself is involved in 80% bifurcation lesion with LCx-OM 2, distally the circumflex branches into OM 3 and OM 4 with 50% stenosis. Low normal EF by echocardiogram with normal LVEDP.      Discussion: After presentation, consideration of treatment options occurred including referral to CT surgery versus medical management. Discussion included that PCI is not an option for patient due to bifurcation disease. Final consensus from the team was that it would be best for the patient to be referred to CTS for possible CABG.    Recommendations: CABG      Alison Murray, RN  03/02/2024 12:53 PM    ATTENDING ATTESTATION  I attended this Team's Heart Team Meeting and presented Mr. Kneale case as the performing provider.  Mr. Nay has multivessel disease that is more appropriate for CABG.  The discussion with the heart team was to determine whether he felt that he would be an appropriate candidate based on the anatomy.  Appreciate the input of the team.  At this point I think the most prudent course of action will be for him to be referred to see Dr. Cliffton Asters with CVTS as an outpatient consult.  I will forward this note to Dr. Antoine Poche and we will place a formal consult for outpatient CVTS.    Marykay Lex, MD, MS Bryan Lemma, M.D., M.S. Interventional Cardiologist  [employer], engineering #  [phone] Phone # [phone] [address] [place], [place] [address]

## 2024-03-03 ENCOUNTER — Encounter: Payer: Self-pay | Admitting: Internal Medicine

## 2024-03-03 DIAGNOSIS — K5900 Constipation, unspecified: Secondary | ICD-10-CM | POA: Insufficient documentation

## 2024-03-03 NOTE — Assessment & Plan Note (Signed)
 Last vitamin D Lab Results  Component Value Date   VD25OH 37.92 05/04/2023   Low, to start oral replacement

## 2024-03-03 NOTE — Assessment & Plan Note (Signed)
 Mild to mod, for miralax 17 gm daily,  to f/u any worsening symptoms or concerns

## 2024-03-03 NOTE — Assessment & Plan Note (Signed)
 BP Readings from Last 3 Encounters:  02/29/24 110/64  02/24/24 131/64  02/20/24 (!) 146/72   Stable, pt to continue medical treatment  - diet, wt control

## 2024-03-03 NOTE — Assessment & Plan Note (Signed)
 Lab Results  Component Value Date   HGBA1C 6.7 (H) 05/04/2023   Stable, pt to continue current medical treatment - diet,wt control

## 2024-03-03 NOTE — Assessment & Plan Note (Signed)
 Tolerating iron supplement, for f/u lab today

## 2024-03-05 ENCOUNTER — Encounter: Payer: Self-pay | Admitting: Internal Medicine

## 2024-03-05 ENCOUNTER — Other Ambulatory Visit (INDEPENDENT_AMBULATORY_CARE_PROVIDER_SITE_OTHER)

## 2024-03-05 DIAGNOSIS — D509 Iron deficiency anemia, unspecified: Secondary | ICD-10-CM | POA: Diagnosis not present

## 2024-03-05 LAB — HEMOCCULT SLIDES (X 3 CARDS)
Fecal Occult Blood: NEGATIVE
OCCULT 1: NEGATIVE
OCCULT 2: NEGATIVE
OCCULT 3: NEGATIVE
OCCULT 4: NEGATIVE
OCCULT 5: NEGATIVE

## 2024-03-05 NOTE — Addendum Note (Signed)
 Addended by: Marcia Brash on: 03/05/2024 04:21 PM   Modules accepted: Orders

## 2024-03-22 ENCOUNTER — Encounter: Payer: Self-pay | Admitting: Emergency Medicine

## 2024-03-22 ENCOUNTER — Ambulatory Visit: Attending: Emergency Medicine | Admitting: Emergency Medicine

## 2024-03-22 VITALS — BP 118/62 | HR 71 | Ht 70.0 in | Wt 196.0 lb

## 2024-03-22 DIAGNOSIS — I10 Essential (primary) hypertension: Secondary | ICD-10-CM

## 2024-03-22 DIAGNOSIS — I779 Disorder of arteries and arterioles, unspecified: Secondary | ICD-10-CM | POA: Diagnosis not present

## 2024-03-22 DIAGNOSIS — R931 Abnormal findings on diagnostic imaging of heart and coronary circulation: Secondary | ICD-10-CM | POA: Diagnosis not present

## 2024-03-22 DIAGNOSIS — E7800 Pure hypercholesterolemia, unspecified: Secondary | ICD-10-CM

## 2024-03-22 DIAGNOSIS — I6521 Occlusion and stenosis of right carotid artery: Secondary | ICD-10-CM | POA: Diagnosis not present

## 2024-03-22 DIAGNOSIS — I2510 Atherosclerotic heart disease of native coronary artery without angina pectoris: Secondary | ICD-10-CM

## 2024-03-22 NOTE — Progress Notes (Signed)
 Cardiology Office Note:    Date:  03/22/2024  ID:  Daniel Rowland, DOB [birth year], MRN [id number] PCP: Corwin Levins, MD  [facility] Providers Cardiologist:  Rollene Rotunda, MD       Patient Profile:      Chief Complaint: Follow-up after cardiac catheterization History of Present Illness:  Daniel Rowland is a 89 y.o. male with visit-pertinent history of coronary artery disease, TIA, mild plaque by duplex 2008, BPH, GERD, diverticulosis, umbilical hernia.  Patient established with cardiology service on 11/03/2022 for evaluation of elevated coronary calcium score.  CT cardiac scoring on 10/18/2022 showed coronary calcium score of 2278 (81st percentile).  He did note to have some shortness of breath.  A POET was ordered however never completed.  He was seen by Ronie Spies, PA for preop request for hernia surgery.  He was noted to have worsening shortness of breath on exertion over the past 6 months.  Echocardiogram was ordered and completed on 02/06/2024 showing LVEF 60-65%, no RWMA RV function mildly reduced, trivial MR, aortic valve sclerosis/calcification without evidence of aortic stenosis.  Cardiac PET was ordered and completed on 02/15/2024 with findings consistent with inferior ischemia and small area of inferior apical infarction.  This study was high risk due to perfusion defect, TID, EF decreased with stress, and abnormal MBFR in the setting of severe coronary calcifications.  He was then seen on follow-up on 02/20/2024 by Dr. Jens Som.  Patient continued to have DOE.  A cardiac catheterization was ordered and completed on 02/24/2024 showing RCA moderate disease proximally and then on a percent CTO in the proximal segment after RVM branch. Left-to-right collaterals from the AV groove branch and septal perforators provide faint collaterals to the PL system and PDA. Calcified left main with no significant disease. Heavily calcified ostial and proximal LAD tandem 80% and 90% stenoses involving  SP1 and diminutive D1; the major D2 has ostial 70% stenosis and proximal 50% stenosis beyond D2 and SP2 there is a 90% eccentric calcified lesion in the LAD with a 70% stenosis after D3, the LAD is in the 100% totally occluded after SP 3 (just beyond D3) -> the mid to apical LAD fills via left-to-right collaterals and appears to be roughly 1 mm in diameter. Large caliber LCx has a calcified 1, 1, 1 bifurcation lesion involving LCx-OM1, then shortly after the AV groove branch there is another eccentric 90% calcified lesion just proximal to OM 2 that itself is involved in 80% bifurcation lesion with LCx-OM 2, distally the circumflex branches into OM 3 and OM 4 with 50% stenosis. Low normal EF by echocardiogram with normal LVEDP.   Patient's case was discussed on 03/02/2024 with the heart team.  It was discussed that his multivessel disease is more appropriate for CABG.   Discussed the use of AI scribe software for clinical note transcription with the patient, who gave verbal consent to proceed.  The patient arrives to clinic today following his recent cardiac catheterization.  He presents with his wife.  The patient reports that he is doing fine overall.  He denies any acute cardiovascular concerns or complaints today.  His only symptoms are some dyspnea on exertion and mild fatigue which have been ongoing for several months now.  He denies any worsening of his dyspnea.  He continues to stay very active at home without complaints of any anginal symptoms on exertion.  He would also like to have his hernia surgery completed because he notes that he does  have to push his hernia back in quite a bit.  He denies chest pain, lower extremity edema, fatigue, palpitations, melena, hematuria, hemoptysis, diaphoresis, weakness, presyncope, syncope, orthopnea, and PND.   Review of systems:  Please see the history of present illness. All other systems are reviewed and otherwise negative.     Home Medications:     Current Meds  Medication Sig   aspirin EC 81 MG tablet Take 81 mg by mouth daily. Swallow whole.   iron polysaccharides (NU-IRON) 150 MG capsule Take 1 capsule (150 mg total) by mouth daily.   lovastatin (MEVACOR) 40 MG tablet TAKE 1 TABLET BY MOUTH EVERY DAY   Multiple Vitamin (MULTIVITAMIN WITH MINERALS) TABS tablet Take 1 tablet by mouth daily.   pantoprazole (PROTONIX) 40 MG tablet Take 1 tablet (40 mg total) by mouth daily.   Studies Reviewed:       Cardiac catheterization 02/24/2024 Severe three-vessel disease, heavily calcified proximal arteries: RCA moderate disease proximally and then on a percent CTO in the proximal segment after RVM branch Left-to-right collaterals from the AV groove branch and septal perforators provide faint collaterals to the PL system and PDA. Calcified left main with no significant disease Heavily calcified ostial and proximal LAD tandem 80% and 90% stenoses involving SP1 and diminutive D1; the major D2 has ostial 70% stenosis and proximal 50% stenosis beyond D2 and SP2 there is a 90% eccentric calcified lesion in the LAD with a 70% stenosis after D3, the LAD is in the 100% totally occluded after SP 3 (just beyond D3) -> the mid to apical LAD fills via left-to-right collaterals and appears to be roughly 1 mm in diameter. Large caliber LCx has a calcified 1, 1, 1 bifurcation lesion involving LCx-OM1, then shortly after the AV groove branch there is another eccentric 90% calcified lesion just proximal to OM 2 that itself is involved in 80% bifurcation lesion with LCx-OM 2, distally the circumflex branches into OM 3 and OM 4 with 50% stenosis. Low normal EF by echocardiogram with normal LVEDP Diagnostic Dominance: Right  Echocardiogram 02/06/2024  1. Left ventricular ejection fraction, by estimation, is 60 to 65%. Left  ventricular ejection fraction by 3D volume is 65 %. The left ventricle has  normal function. The left ventricle has no regional wall motion   abnormalities. Left ventricular diastolic   parameters were normal. The average left ventricular global longitudinal  strain is -21.0 %. The global longitudinal strain is normal.   2. Right ventricular systolic function is mildly reduced. The right  ventricular size is normal.   3. The mitral valve is normal in structure. Trivial mitral valve  regurgitation. No evidence of mitral stenosis.   4. The aortic valve is calcified. There is mild calcification of the  aortic valve. There is mild thickening of the aortic valve. Aortic valve  regurgitation is not visualized. Aortic valve sclerosis/calcification is  present, without any evidence of  aortic stenosis.   5. The inferior vena cava is normal in size with greater than 50%  respiratory variability, suggesting right atrial pressure of 3 mmHg.  Risk Assessment/Calculations:             Physical Exam:   VS:  BP 118/62 (BP Location: Left Arm, Patient Position: Sitting, Cuff Size: Normal)   Pulse 71   Ht 5\' 10"  (1.778 m)   Wt 196 lb (88.9 kg)   BMI 28.12 kg/m    Wt Readings from Last 3 Encounters:  03/22/24 196 lb (88.9 kg)  02/29/24 198 lb (89.8 kg)  02/24/24 200 lb (90.7 kg)    GEN: Well nourished, well developed in no acute distress NECK: No JVD; No carotid bruits CARDIAC: RRR, no murmurs, rubs, gallops RESPIRATORY:  Clear to auscultation without rales, wheezing or rhonchi  ABDOMEN: Soft, non-tender, non-distended EXTREMITIES:  No edema; No acute deformity     Assessment and Plan:  Coronary artery disease Recent cardiac catheterization on 02/2024 showed severe three-vessel disease with heavily calcified proximal arteries with details noted above with low normal EF by echocardiogram with normal LVEDP.  Patient's case was discussed on 03/02/2024 during heart team consultation recommendation was for CABG as discussion included that PCI was not an option for patient due to bifurcation disease. - Today patient is doing well overall.   He notes mild and ongoing dyspnea on exertion for several months that has not increased in severity or frequency.  He does note worsening of his fatigue as of late.  However he is without any chest pains or exertional angina.  He denies any orthopnea, PND, leg swelling. - He is still very active at home and notes that he has had no change in his activity due to his symptoms. - Today I will refer patient to CVTS to see Dr. Cliffton Asters as an outpatient consult for consideration of CABG. - Continue aspirin 81 mg daily and lovastatin 40 mg daily  Hypertension His blood pressure today is 118/62 and under excellent control not on current antihypertensive medication  Hyperlipidemia LDL 61, TG 79, TC 123 on 04/2023 LDL is under excellent control and under goal less than 70 - Continue lovastatin 40 mg daily     Dispo:  Return in about 3 months (around 06/21/2024).  Signed, Denyce Robert, NP

## 2024-03-22 NOTE — Patient Instructions (Signed)
 Medication Instructions:  NO CHANGES   Lab Work: BMET AND CBC TO BE DONE TODAY.    Testing/Procedures: NONE  Follow-Up: At [facility], you and your health needs are our priority.  As part of our continuing mission to provide you with exceptional heart care, our providers are all part of one team.  This team includes your primary Cardiologist (physician) and Advanced Practice Providers or APPs (Physician Assistants and Nurse Practitioners) who all work together to provide you with the care you need, when you need it.  Your next appointment:   3 month(s)  Provider:   Rollene Rotunda, MD       Other Instructions:      1st Floor: - Lobby - Registration  - Pharmacy  - Lab - Cafe  2nd Floor: - PV Lab - Diagnostic Testing (echo, CT, nuclear med)  3rd Floor: - Vacant  4th Floor: - TCTS (cardiothoracic surgery) - AFib Clinic - Structural Heart Clinic - Vascular Surgery  - Vascular Ultrasound  5th Floor: - HeartCare Cardiology (general and EP) - Clinical Pharmacy for coumadin, hypertension, lipid, weight-loss medications, and med management appointments    Valet parking services will be available as well.
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
## 2024-03-23 ENCOUNTER — Institutional Professional Consult (permissible substitution): Admitting: Thoracic Surgery (Cardiothoracic Vascular Surgery)

## 2024-03-23 DIAGNOSIS — Z8546 Personal history of malignant neoplasm of prostate: Secondary | ICD-10-CM | POA: Diagnosis not present

## 2024-03-23 DIAGNOSIS — R339 Retention of urine, unspecified: Secondary | ICD-10-CM | POA: Diagnosis not present

## 2024-03-23 DIAGNOSIS — C61 Malignant neoplasm of prostate: Secondary | ICD-10-CM | POA: Diagnosis not present

## 2024-03-23 LAB — BASIC METABOLIC PANEL WITH GFR
BUN/Creatinine Ratio: 10 (ref 10–24)
BUN: 11 mg/dL (ref 8–27)
CO2: 24 mmol/L (ref 20–29)
Calcium: 9.3 mg/dL (ref 8.6–10.2)
Chloride: 106 mmol/L (ref 96–106)
Creatinine, Ser: 1.11 mg/dL (ref 0.76–1.27)
Glucose: 85 mg/dL (ref 70–99)
Potassium: 5.2 mmol/L (ref 3.5–5.2)
Sodium: 143 mmol/L (ref 134–144)
eGFR: 65 mL/min/{1.73_m2} (ref >59)

## 2024-03-23 LAB — CBC
Hematocrit: 42 % (ref 37.5–51.0)
Hemoglobin: 13.3 g/dL (ref 13.0–17.7)
MCH: 27.7 pg (ref 26.6–33.0)
MCHC: 31.7 g/dL (ref 31.5–35.7)
MCV: 88 fL (ref 79–97)
Platelets: 317 10*3/uL (ref 150–450)
RBC: 4.8 x10E6/uL (ref 4.14–5.80)
RDW: 15.9 % — ABNORMAL HIGH (ref 11.6–15.4)
WBC: 6.7 10*3/uL (ref 3.4–10.8)

## 2024-04-03 ENCOUNTER — Other Ambulatory Visit (HOSPITAL_COMMUNITY): Payer: Medicare HMO

## 2024-04-04 ENCOUNTER — Other Ambulatory Visit (HOSPITAL_COMMUNITY): Payer: Medicare HMO

## 2024-04-09 ENCOUNTER — Institutional Professional Consult (permissible substitution): Admitting: Surgery

## 2024-04-09 ENCOUNTER — Encounter: Payer: Self-pay | Admitting: Surgery

## 2024-04-09 VITALS — BP 132/69 | HR 82 | Resp 20 | Ht 70.0 in | Wt 195.0 lb

## 2024-04-09 DIAGNOSIS — I2510 Atherosclerotic heart disease of native coronary artery without angina pectoris: Secondary | ICD-10-CM | POA: Diagnosis not present
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
## 2024-04-12 ENCOUNTER — Ambulatory Visit: Payer: Medicare HMO | Admitting: Physician Assistant

## 2024-04-19 DIAGNOSIS — C61 Malignant neoplasm of prostate: Secondary | ICD-10-CM | POA: Diagnosis not present

## 2024-04-19 DIAGNOSIS — R339 Retention of urine, unspecified: Secondary | ICD-10-CM | POA: Diagnosis not present

## 2024-05-08 ENCOUNTER — Encounter: Payer: Self-pay | Admitting: Internal Medicine

## 2024-05-08 ENCOUNTER — Ambulatory Visit (INDEPENDENT_AMBULATORY_CARE_PROVIDER_SITE_OTHER): Admitting: Internal Medicine

## 2024-05-08 ENCOUNTER — Ambulatory Visit: Payer: Self-pay | Admitting: Internal Medicine

## 2024-05-08 ENCOUNTER — Other Ambulatory Visit: Payer: Self-pay | Admitting: Internal Medicine

## 2024-05-08 VITALS — BP 120/78 | HR 65 | Temp 98.4°F | Ht 70.0 in | Wt 197.0 lb

## 2024-05-08 DIAGNOSIS — Z0001 Encounter for general adult medical examination with abnormal findings: Secondary | ICD-10-CM

## 2024-05-08 DIAGNOSIS — R7303 Prediabetes: Secondary | ICD-10-CM

## 2024-05-08 DIAGNOSIS — E7849 Other hyperlipidemia: Secondary | ICD-10-CM

## 2024-05-08 DIAGNOSIS — Z0000 Encounter for general adult medical examination without abnormal findings: Secondary | ICD-10-CM

## 2024-05-08 DIAGNOSIS — K4090 Unilateral inguinal hernia, without obstruction or gangrene, not specified as recurrent: Secondary | ICD-10-CM

## 2024-05-08 DIAGNOSIS — I10 Essential (primary) hypertension: Secondary | ICD-10-CM | POA: Diagnosis not present

## 2024-05-08 DIAGNOSIS — E559 Vitamin D deficiency, unspecified: Secondary | ICD-10-CM | POA: Diagnosis not present

## 2024-05-08 DIAGNOSIS — D509 Iron deficiency anemia, unspecified: Secondary | ICD-10-CM | POA: Diagnosis not present

## 2024-05-08 DIAGNOSIS — E538 Deficiency of other specified B group vitamins: Secondary | ICD-10-CM

## 2024-05-08 LAB — CBC WITH DIFFERENTIAL/PLATELET
Basophils Absolute: 0 10*3/uL (ref 0.0–0.1)
Basophils Relative: 0.7 % (ref 0.0–3.0)
Eosinophils Absolute: 0.1 10*3/uL (ref 0.0–0.7)
Eosinophils Relative: 2 % (ref 0.0–5.0)
HCT: 41.2 % (ref 39.0–52.0)
Hemoglobin: 13.7 g/dL (ref 13.0–17.0)
Lymphocytes Relative: 22.8 % (ref 12.0–46.0)
Lymphs Abs: 1.4 10*3/uL (ref 0.7–4.0)
MCHC: 33.3 g/dL (ref 30.0–36.0)
MCV: 86.6 fl (ref 78.0–100.0)
Monocytes Absolute: 0.8 10*3/uL (ref 0.1–1.0)
Monocytes Relative: 14 % — ABNORMAL HIGH (ref 3.0–12.0)
Neutro Abs: 3.6 10*3/uL (ref 1.4–7.7)
Neutrophils Relative %: 60.5 % (ref 43.0–77.0)
Platelets: 257 10*3/uL (ref 150.0–400.0)
RBC: 4.75 Mil/uL (ref 4.22–5.81)
RDW: 18.4 % — ABNORMAL HIGH (ref 11.5–15.5)
WBC: 6 10*3/uL (ref 4.0–10.5)

## 2024-05-08 LAB — IBC PANEL
Iron: 101 ug/dL (ref 42–165)
Saturation Ratios: 31.8 % (ref 20.0–50.0)
TIBC: 317.8 ug/dL (ref 250.0–450.0)
Transferrin: 227 mg/dL (ref 212.0–360.0)

## 2024-05-08 LAB — BASIC METABOLIC PANEL WITH GFR
BUN: 16 mg/dL (ref 6–23)
CO2: 25 meq/L (ref 19–32)
Calcium: 9 mg/dL (ref 8.4–10.5)
Chloride: 107 meq/L (ref 96–112)
Creatinine, Ser: 1.16 mg/dL (ref 0.40–1.50)
GFR: 57.06 mL/min — ABNORMAL LOW (ref >60.00)
Glucose, Bld: 93 mg/dL (ref 70–99)
Potassium: 4.3 meq/L (ref 3.5–5.1)
Sodium: 141 meq/L (ref 135–145)

## 2024-05-08 LAB — TSH: TSH: 1.7 u[IU]/mL (ref 0.35–5.50)

## 2024-05-08 LAB — LIPID PANEL
Cholesterol: 112 mg/dL (ref 0–200)
HDL: 43.1 mg/dL (ref >39.00)
LDL Cholesterol: 56 mg/dL (ref 0–99)
NonHDL: 68.48
Total CHOL/HDL Ratio: 3
Triglycerides: 62 mg/dL (ref 0.0–149.0)
VLDL: 12.4 mg/dL (ref 0.0–40.0)

## 2024-05-08 LAB — VITAMIN D 25 HYDROXY (VIT D DEFICIENCY, FRACTURES): VITD: 36.09 ng/mL (ref 30.00–100.00)

## 2024-05-08 LAB — FERRITIN: Ferritin: 23.2 ng/mL (ref 22.0–322.0)

## 2024-05-08 LAB — HEPATIC FUNCTION PANEL
ALT: 15 U/L (ref 0–53)
AST: 21 U/L (ref 0–37)
Albumin: 4 g/dL (ref 3.5–5.2)
Alkaline Phosphatase: 96 U/L (ref 39–117)
Bilirubin, Direct: 0.1 mg/dL (ref 0.0–0.3)
Total Bilirubin: 0.5 mg/dL (ref 0.2–1.2)
Total Protein: 6.9 g/dL (ref 6.0–8.3)

## 2024-05-08 LAB — VITAMIN B12: Vitamin B-12: 534 pg/mL (ref 211–911)

## 2024-05-08 LAB — HEMOGLOBIN A1C: Hgb A1c MFr Bld: 6.7 % — ABNORMAL HIGH (ref 4.6–6.5)
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
## 2024-05-08 NOTE — Patient Instructions (Signed)

## 2024-05-09 ENCOUNTER — Encounter: Payer: Self-pay | Admitting: Internal Medicine

## 2024-05-09 NOTE — Assessment & Plan Note (Signed)
 BP Readings from Last 3 Encounters:  05/08/24 120/78  04/09/24 132/69  03/22/24 118/62   Stable, pt to continue medical treatment  - diet, wt control

## 2024-05-09 NOTE — Assessment & Plan Note (Signed)
 Lab Results  Component Value Date   HGBA1C 6.7 (H) 05/08/2024   Stable, pt to continue current medical treatment  - diet , wt control

## 2024-05-09 NOTE — Telephone Encounter (Signed)
 Copied from CRM [id number]. Topic: Clinical - Medical Advice >> May 09, 2024  9:16 AM Adonis Hoot wrote: Reason for CRM: Patients wife called in regarding lab results that she received through mychart and seen that his RDW Is high,and she stated that he is taking iron ,and would like to know if it could possibly be the cause of the elevated RDW? She said that he doesn't have any energy and was wondering if he could possibly be taking too much iron .

## 2024-05-09 NOTE — Assessment & Plan Note (Signed)

## 2024-05-09 NOTE — Assessment & Plan Note (Signed)
Overall stable,  to f/u any worsening symptoms or concerns 

## 2024-05-09 NOTE — Addendum Note (Signed)
 Addended by: Roslyn Coombe on: 05/09/2024 07:04 AM   Modules accepted: Level of Service

## 2024-05-09 NOTE — Telephone Encounter (Signed)
 No, the RDW is not clinically important, so no need for any changes based on this    it is more of a research test to tell the size of the blood cells

## 2024-05-09 NOTE — Assessment & Plan Note (Signed)
 Lab Results  Component Value Date   LDLCALC 56 05/08/2024   Stable, pt to continue current statin lovastatin  40 qd

## 2024-05-09 NOTE — Assessment & Plan Note (Signed)
 Last vitamin D  Lab Results  Component Value Date   VD25OH 36.09 05/08/2024   Low, to start oral replacement

## 2024-05-09 NOTE — Assessment & Plan Note (Signed)
 Now with chronic iron  supuplementation, for f/u iron  levels but unless elevated should continue iron  supplement

## 2024-05-10 LAB — URINALYSIS, ROUTINE W REFLEX MICROSCOPIC
Bilirubin Urine: NEGATIVE
Ketones, ur: NEGATIVE
Nitrite: NEGATIVE
Specific Gravity, Urine: 1.015 (ref 1.000–1.030)
Urine Glucose: NEGATIVE
Urobilinogen, UA: 1 (ref 0.0–1.0)
pH: 7 (ref 5.0–8.0)

## 2024-05-21 DIAGNOSIS — Z8546 Personal history of malignant neoplasm of prostate: Secondary | ICD-10-CM | POA: Diagnosis not present

## 2024-05-21 DIAGNOSIS — C61 Malignant neoplasm of prostate: Secondary | ICD-10-CM | POA: Diagnosis not present

## 2024-05-21 DIAGNOSIS — R339 Retention of urine, unspecified: Secondary | ICD-10-CM | POA: Diagnosis not present

## 2024-05-23 DIAGNOSIS — H52223 Regular astigmatism, bilateral: Secondary | ICD-10-CM | POA: Diagnosis not present

## 2024-05-23 DIAGNOSIS — H524 Presbyopia: Secondary | ICD-10-CM | POA: Diagnosis not present

## 2024-05-23 DIAGNOSIS — Z961 Presence of intraocular lens: Secondary | ICD-10-CM | POA: Diagnosis not present

## 2024-05-23 DIAGNOSIS — H5203 Hypermetropia, bilateral: Secondary | ICD-10-CM | POA: Diagnosis not present

## 2024-05-23 DIAGNOSIS — Z135 Encounter for screening for eye and ear disorders: Secondary | ICD-10-CM | POA: Diagnosis not present

## 2024-05-25 DIAGNOSIS — I2510 Atherosclerotic heart disease of native coronary artery without angina pectoris: Secondary | ICD-10-CM | POA: Diagnosis not present

## 2024-05-25 DIAGNOSIS — E785 Hyperlipidemia, unspecified: Secondary | ICD-10-CM | POA: Diagnosis not present

## 2024-05-25 DIAGNOSIS — Z1330 Encounter for screening examination for mental health and behavioral disorders, unspecified: Secondary | ICD-10-CM | POA: Diagnosis not present

## 2024-06-19 DIAGNOSIS — C61 Malignant neoplasm of prostate: Secondary | ICD-10-CM | POA: Diagnosis not present

## 2024-06-19 DIAGNOSIS — R339 Retention of urine, unspecified: Secondary | ICD-10-CM | POA: Diagnosis not present

## 2024-06-19 DIAGNOSIS — Z8546 Personal history of malignant neoplasm of prostate: Secondary | ICD-10-CM | POA: Diagnosis not present

## 2024-06-20 NOTE — Progress Notes (Unsigned)
  Cardiology Office Note:   Date:  06/21/2024  ID:  Daniel Rowland, DOB 06-28-1935, MRN [id number] PCP: Norleen Lynwood ORN, MD  [facility] Providers Cardiologist:  Lynwood Schilling, MD {  History of Present Illness:   Daniel Rowland is a 89 y.o. male who presents for evaluation of elevated coronary calcium .   His score was 2278.   He has not ever had any cardiac history.  I do see an echo in 2007 was essentially unremarkable.  Apparently this was done for evaluation of a TIA.   There is a mention of him having some carotid plaque in 2018 Doppler demonstrated no significant stenosis.  He did have aortic atherosclerosis on his coronary calcium  score.  Since I last saw him in 2023.   He had cath in March 2025 and he had 3 vessel CAD that was managed medically.  The cath was done to evaluate SOB.    He  has a well preserved EF.  He saw Dr. Lucas in consultation.  He went to [facility] and had a second opinion.  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  Because it was an elective umbilical hernia repair it has been decided to not do this.  He is not having any symptoms related to this.  He still does some yard work.  He did some weed eating this week.  ROS: As stated in the HPI and negative for all other systems.  Studies Reviewed:    EKG:     NA  Risk Assessment/Calculations:              Physical Exam:   VS:  BP (!) 110/58   Pulse 64   Ht 5' 10 (1.778 m)   Wt 198 lb 1.6 oz (89.9 kg)   SpO2 95%   BMI 28.42 kg/m    Wt Readings from Last 3 Encounters:  06/21/24 198 lb 1.6 oz (89.9 kg)  05/08/24 197 lb (89.4 kg)  04/09/24 195 lb (88.5 kg)     GEN: Well nourished, well developed in no acute distress NECK: No JVD; No carotid bruits CARDIAC: RRR, 3 out of 6 apical systolic murmur radiating slightly at the aortic outflow tract, no diastolic murmurs, rubs, gallops RESPIRATORY:  Clear to auscultation without rales, wheezing or rhonchi  ABDOMEN: Soft, non-tender,  non-distended EXTREMITIES:  No edema; No deformity   ASSESSMENT AND PLAN:   CAD:   The patient has three-vessel coronary artery disease but no symptoms.  He is gena get sublingual nitroglycerin.  He was started on a low-dose of beta-blocker.  Blood pressure would not allow other med titration.  We are all in agreement with medical management.    HTN: Blood pressure is well-controlled.  No change in therapy.   Dyslipidemia: Lipids are at target with an LDL of 56.  No change in therapy.   MURMUR: He has some mild aortic sclerosis.  There are some mild RV dysfunction but otherwise the echo was unremarkable earlier this year.  No change in therapy.    Follow up with me in six months.   Signed, Lynwood Schilling, MD

## 2024-06-21 ENCOUNTER — Encounter: Payer: Self-pay | Admitting: Cardiology

## 2024-06-21 ENCOUNTER — Ambulatory Visit: Attending: Cardiology | Admitting: Cardiology

## 2024-06-21 ENCOUNTER — Other Ambulatory Visit (HOSPITAL_COMMUNITY): Payer: Self-pay

## 2024-06-21 VITALS — BP 110/58 | HR 64 | Ht 70.0 in | Wt 198.1 lb

## 2024-06-21 DIAGNOSIS — I2510 Atherosclerotic heart disease of native coronary artery without angina pectoris: Secondary | ICD-10-CM

## 2024-06-21 DIAGNOSIS — E785 Hyperlipidemia, unspecified: Secondary | ICD-10-CM

## 2024-06-21 DIAGNOSIS — I10 Essential (primary) hypertension: Secondary | ICD-10-CM | POA: Diagnosis not present

## 2024-06-21 DIAGNOSIS — Z01810 Encounter for preprocedural cardiovascular examination: Secondary | ICD-10-CM

## 2024-06-21 MED ORDER — METOPROLOL SUCCINATE ER 25 MG PO TB24
25.0000 mg | ORAL_TABLET | Freq: Every day | ORAL | 3 refills | Status: AC
  Filled 2024-06-21: qty 90, 90d supply, fill #0

## 2024-06-21 MED ORDER — NITROGLYCERIN 0.4 MG SL SUBL
0.4000 mg | SUBLINGUAL_TABLET | SUBLINGUAL | 10 refills | Status: AC | PRN
  Filled 2024-06-21: qty 25, 15d supply, fill #0

## 2024-06-21 NOTE — Patient Instructions (Signed)
 Medication Instructions:  Nitroglycerin as needed for chest pain *If you need a refill on your cardiac medications before your next appointment, please call your pharmacy*  Lab Work: NONE If you have labs (blood work) drawn today and your tests are completely normal, you will receive your results only by: MyChart Message (if you have MyChart) OR A paper copy in the mail If you have any lab test that is abnormal or we need to change your treatment, we will call you to review the results.  Testing/Procedures: NONE  Follow-Up: At [facility], you and your health needs are our priority.  As part of our continuing mission to provide you with exceptional heart care, our providers are all part of one team.  This team includes your primary Cardiologist (physician) and Advanced Practice Providers or APPs (Physician Assistants and Nurse Practitioners) who all work together to provide you with the care you need, when you need it.  Your next appointment:   6 month(s)  Provider:   Lynwood Schilling, MD    We recommend signing up for the patient portal called MyChart.  Sign up information is provided on this After Visit Summary.  MyChart is used to connect with patients for Virtual Visits (Telemedicine).  Patients are able to view lab/test results, encounter notes, upcoming appointments, etc.  Non-urgent messages can be sent to your provider as well.   To learn more about what you can do with MyChart, go to [email or web address].

## 2024-07-16 ENCOUNTER — Telehealth: Payer: Self-pay | Admitting: Internal Medicine

## 2024-07-16 NOTE — Telephone Encounter (Unsigned)
 Copied from CRM [id number]. Topic: Referral - Request for Referral >> Jul 16, 2024 10:16 AM Mia F wrote: Did the patient discuss referral with their provider in the last year? No (If No - schedule appointment) (If Yes - send message)  Appointment offered? Yes  Type of order/referral and detailed reason for visit:   Gastro. Pt wife says pt has had diarrhea since he has been on radiation back in 2013. She says it has been going on since and patient just blames it on the radiation. She says it can come really bad one day and last for days and then going away and not come back for some time. It can come one day and be gone the next. She says this has been going on since 2013 and at this point something need to be addressed.   Preference of office, provider, location: Dr Glendia Holt  If referral order, have you been seen by this specialty before? No (If Yes, this issue or another issue? When? Where?  Can we respond through MyChart? Yes

## 2024-07-16 NOTE — Telephone Encounter (Signed)
 Needs ROV please for persistent symptoms and should go to UC or ED if any fever or worsening pain

## 2024-07-19 NOTE — Telephone Encounter (Signed)
 Called and spoke with patient spouse. Informed her that we would want to see patient in for this issue prior to getting referral out. Have them currently scheduled for next week to follow up

## 2024-07-20 DIAGNOSIS — C61 Malignant neoplasm of prostate: Secondary | ICD-10-CM | POA: Diagnosis not present

## 2024-07-20 DIAGNOSIS — R339 Retention of urine, unspecified: Secondary | ICD-10-CM | POA: Diagnosis not present

## 2024-07-20 DIAGNOSIS — Z8546 Personal history of malignant neoplasm of prostate: Secondary | ICD-10-CM | POA: Diagnosis not present

## 2024-07-23 DIAGNOSIS — K219 Gastro-esophageal reflux disease without esophagitis: Secondary | ICD-10-CM | POA: Diagnosis not present

## 2024-07-23 DIAGNOSIS — Z7982 Long term (current) use of aspirin: Secondary | ICD-10-CM | POA: Diagnosis not present

## 2024-07-23 DIAGNOSIS — Z008 Encounter for other general examination: Secondary | ICD-10-CM | POA: Diagnosis not present

## 2024-07-23 DIAGNOSIS — E1122 Type 2 diabetes mellitus with diabetic chronic kidney disease: Secondary | ICD-10-CM | POA: Diagnosis not present

## 2024-07-23 DIAGNOSIS — I2510 Atherosclerotic heart disease of native coronary artery without angina pectoris: Secondary | ICD-10-CM | POA: Diagnosis not present

## 2024-07-23 DIAGNOSIS — Z833 Family history of diabetes mellitus: Secondary | ICD-10-CM | POA: Diagnosis not present

## 2024-07-23 DIAGNOSIS — Z8249 Family history of ischemic heart disease and other diseases of the circulatory system: Secondary | ICD-10-CM | POA: Diagnosis not present

## 2024-07-23 DIAGNOSIS — E669 Obesity, unspecified: Secondary | ICD-10-CM | POA: Diagnosis not present

## 2024-07-23 DIAGNOSIS — I700 Atherosclerosis of aorta: Secondary | ICD-10-CM | POA: Diagnosis not present

## 2024-07-23 DIAGNOSIS — Z87891 Personal history of nicotine dependence: Secondary | ICD-10-CM | POA: Diagnosis not present

## 2024-07-23 DIAGNOSIS — E1142 Type 2 diabetes mellitus with diabetic polyneuropathy: Secondary | ICD-10-CM | POA: Diagnosis not present

## 2024-07-23 DIAGNOSIS — Z809 Family history of malignant neoplasm, unspecified: Secondary | ICD-10-CM | POA: Diagnosis not present

## 2024-07-23 DIAGNOSIS — E785 Hyperlipidemia, unspecified: Secondary | ICD-10-CM | POA: Diagnosis not present

## 2024-07-24 ENCOUNTER — Ambulatory Visit (INDEPENDENT_AMBULATORY_CARE_PROVIDER_SITE_OTHER): Admitting: Internal Medicine

## 2024-07-24 ENCOUNTER — Encounter: Payer: Self-pay | Admitting: Internal Medicine

## 2024-07-24 VITALS — BP 102/62 | HR 58 | Temp 97.8°F | Ht 70.0 in | Wt 196.6 lb

## 2024-07-24 DIAGNOSIS — I10 Essential (primary) hypertension: Secondary | ICD-10-CM

## 2024-07-24 DIAGNOSIS — R159 Full incontinence of feces: Secondary | ICD-10-CM | POA: Diagnosis not present

## 2024-07-24 DIAGNOSIS — R197 Diarrhea, unspecified: Secondary | ICD-10-CM | POA: Diagnosis not present

## 2024-07-24 DIAGNOSIS — R7303 Prediabetes: Secondary | ICD-10-CM

## 2024-07-24 DIAGNOSIS — E559 Vitamin D deficiency, unspecified: Secondary | ICD-10-CM | POA: Diagnosis not present

## 2024-07-24 NOTE — Assessment & Plan Note (Signed)
 Also for immodium otc prn, declines GI referral for now
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
## 2024-07-24 NOTE — Assessment & Plan Note (Signed)
 Last vitamin D  Lab Results  Component Value Date   VD25OH 36.09 05/08/2024   Low, to start oral replacement

## 2024-07-24 NOTE — Assessment & Plan Note (Signed)
 BP Readings from Last 3 Encounters:  07/24/24 102/62  06/21/24 (!) 110/58  05/08/24 120/78   Stable, pt to continue medical treatment toprol  xl 25 qd

## 2024-07-24 NOTE — Assessment & Plan Note (Signed)
 Pt now for GI panel by PCR

## 2024-07-24 NOTE — Assessment & Plan Note (Signed)
 Lab Results  Component Value Date   HGBA1C 6.7 (H) 05/08/2024   Stable, pt to continue current medical treatment  - diet , wt control

## 2024-07-24 NOTE — Patient Instructions (Addendum)
 Ok to take the immodium as needed  Please continue all other medications as before, and refills have been done if requested.  Please have the pharmacy call with any other refills you may need.  Please keep your appointments with your specialists as you may have planned  Please call if you change your mind about GI referral  Please go to the LAB at the blood drawing area for the tests to be done - just the stool testing today  You will be contacted by phone if any changes need to be made immediately.  Otherwise, you will receive a letter about your results with an explanation, but please check with MyChart first.  Please make an Appointment to return in 6 months, or sooner if needed

## 2024-07-25 ENCOUNTER — Other Ambulatory Visit: Payer: Self-pay | Admitting: Internal Medicine

## 2024-07-25 ENCOUNTER — Ambulatory Visit: Payer: Self-pay | Admitting: Internal Medicine

## 2024-07-25 DIAGNOSIS — A0472 Enterocolitis due to Clostridium difficile, not specified as recurrent: Secondary | ICD-10-CM

## 2024-07-25 LAB — GI PROFILE, STOOL, PCR

## 2024-07-25 MED ORDER — VANCOMYCIN HCL 125 MG PO CAPS: 125.0000 mg | ORAL_CAPSULE | Freq: Four times a day (QID) | ORAL | 0 refills | Status: AC

## 2024-08-02 ENCOUNTER — Telehealth: Payer: Self-pay

## 2024-08-02 NOTE — Telephone Encounter (Signed)
 Copied from CRM [id number]. Topic: General - Other >> Aug 02, 2024 10:44 AM Macario HERO wrote: Reason for CRM: Patient spouse called said patient finished the medication vancomycin  (VANCOCIN ) 125 MG capsule [[id number]] and wants to know if they need to schedule an appointment to come in to make sure bacteria was gone.

## 2024-08-03 NOTE — Telephone Encounter (Signed)
**Note De-identified  [place] Obfuscation** Please advise 

## 2024-08-03 NOTE — Telephone Encounter (Signed)
 No, but they have also been referred to GI for that purpose.

## 2024-08-06 NOTE — Telephone Encounter (Signed)
 Patient and his wife has been made aware and gave a verbal understanding.

## 2024-08-10 ENCOUNTER — Ambulatory Visit: Payer: Self-pay

## 2024-08-10 NOTE — Telephone Encounter (Signed)
 Needs to go to ED now for melena, as this often means internal bleeding, unless he is taking the iron  supplement, or recent peptobismol or collard greens.    Just let me know, and I believe pt has been referred to GI already

## 2024-08-10 NOTE — Telephone Encounter (Signed)
 FYI Only or Action Required?: Action required by provider: referral request.  Patient was last seen in primary care on 07/24/2024 by Norleen Lynwood ORN, MD.  Called Nurse Triage reporting Melena.  Symptoms began several weeks ago.  Interventions attempted: Prescription medications: antibiotics.  Symptoms are: unchanged. Wife reports that pt. Does want a GI referral. Also, does pt. Need some follow up testing for the C-Diff he just had, please advise pt.   Triage Disposition: See PCP When Office is Open (Within 3 Days)  Patient/caregiver understands and will follow disposition?: Yes   Copied from CRM #[id number]. Topic: Clinical - Red Word Triage >> Aug 10, 2024  9:44 AM Rosina BIRCH wrote: Red Word that prompted transfer to Nurse Triage: patient wife called stating the patient is having black stool and diarrhea Reason for Disposition  [1] Abnormal color is unexplained AND [2] persists > 24 hours  Answer Assessment - Initial Assessment Questions 1. COLOR: What color is your stool? Is that color in part or all of the stool? (e.g., black, clay-colored, green, red)      black 2. ONSET: When did you first notice this color change?     For awhile 3. CAUSE: Have you eaten any food or taken any medicine of this color? Note: See listing in Background Information section.      no 4. OTHER SYMPTOMS: Do you have any other symptoms? (e.g., abdomen pain, diarrhea, fever, yellow eyes or skin).     diarrhea  Protocols used: Stools - Unusual Color-A-AH

## 2024-08-15 ENCOUNTER — Ambulatory Visit: Payer: Self-pay | Admitting: Podiatry

## 2024-08-15 ENCOUNTER — Encounter: Payer: Self-pay | Admitting: Podiatry

## 2024-08-15 ENCOUNTER — Ambulatory Visit (INDEPENDENT_AMBULATORY_CARE_PROVIDER_SITE_OTHER)

## 2024-08-15 VITALS — Ht 70.0 in | Wt 196.0 lb

## 2024-08-15 DIAGNOSIS — M7751 Other enthesopathy of right foot: Secondary | ICD-10-CM

## 2024-08-15 DIAGNOSIS — L600 Ingrowing nail: Secondary | ICD-10-CM | POA: Diagnosis not present

## 2024-08-15 DIAGNOSIS — M2041 Other hammer toe(s) (acquired), right foot: Secondary | ICD-10-CM

## 2024-08-15 NOTE — Progress Notes (Addendum)
 Patient complains of painful ingrown lateral border fourth toe right is noticed some drainage from the area.. Patient denies fevers, chills, nausea, vomiting.  Objective:  Vitals: Reviewed  General: Well developed, nourished, in no acute distress, alert and oriented x3   Vascular: DP pulse 2/4 bilateral. PT pulse 1/4 bilateral.  Mild edema fourth toe right  Dermatology: Erythema, edema, incurvated nail border lateral fourth toe right with mild clear drainage . Tenderness present with palpation. Normal skin tone and texture feet with normal hair growth.  Neurological: Grossly intact. Normal reflexes.   Musculoskeletal: Tenderness with palpation of the distal distal fourth toe right. No tenderness or painful ROM at IPJ.  Radiographs: 3 views foot right: Hammertoes 2 through 5 right.  No assenting fractures or dislocations no periosteal reactions.  No evidence of any osteomyelitis.  Diagnosis: 1.  Ingrown nail lateral border fourth toe right 2.  Hammertoe fourth toe right  Plan: -discussed etiology and treatment of ingrown nails. Discussed surgical vs conservative treatment. -Consent signed for appropriate matrixectomy affected nail(s).   Procedure(s):   - Matrixectomy(s) lateral border fourth toe right: Toe anesthetized with 3cc 2:1 mixture 2% Lidocaine  with epinephrine: Sodium Bicarbonate. Surgical site prepped. Digital tourniquet applied.  Avulsion of nail border. performed. Matrixecomy performed with three 30 second applications of phenol to nail matrix. Site irrigated with alcohol.  Tourniquet released with good vascularity noticed in digit.  Applied triple antibiotic to nailbed and applied gauze and Coban dressing. - Written and oral postoperative instructions given.  -Return for post-op 2 weeks and discuss neuropathy.  JINNY Prentice Binder, DPM

## 2024-08-15 NOTE — Telephone Encounter (Signed)
 Left voice mail for pt to give us  a call back and also will be send mychart message.

## 2024-08-15 NOTE — Patient Instructions (Signed)

## 2024-08-17 ENCOUNTER — Encounter: Payer: Self-pay | Admitting: Physician Assistant

## 2024-08-21 DIAGNOSIS — Z8546 Personal history of malignant neoplasm of prostate: Secondary | ICD-10-CM | POA: Diagnosis not present

## 2024-08-21 DIAGNOSIS — R339 Retention of urine, unspecified: Secondary | ICD-10-CM | POA: Diagnosis not present

## 2024-08-21 DIAGNOSIS — C61 Malignant neoplasm of prostate: Secondary | ICD-10-CM | POA: Diagnosis not present

## 2024-08-28 ENCOUNTER — Telehealth: Payer: Self-pay | Admitting: Internal Medicine

## 2024-08-28 NOTE — Telephone Encounter (Signed)
 Called and left voice message for patient to call back to schedule flu shot.

## 2024-08-30 ENCOUNTER — Ambulatory Visit (INDEPENDENT_AMBULATORY_CARE_PROVIDER_SITE_OTHER): Admitting: Podiatry

## 2024-08-30 DIAGNOSIS — G5790 Unspecified mononeuropathy of unspecified lower limb: Secondary | ICD-10-CM | POA: Diagnosis not present

## 2024-08-30 DIAGNOSIS — G609 Hereditary and idiopathic neuropathy, unspecified: Secondary | ICD-10-CM | POA: Diagnosis not present

## 2024-08-30 NOTE — Progress Notes (Signed)
 Patient presents for nail check fourth toe right.  Doing well with no complaints.  Also complains of numbness and some discomfort in the feet.  He is not a diabetic and has no history of chemotherapy or drug or alcohol abuse.  Gets numbness in his feet where he cannot feel things.  Does not get any significant burning.  Some tingling at times.   Physical exam:  General appearance: Pleasant, and in no acute distress. AOx3.  Vascular: Pedal pulses: DP 2/4 bilaterally, PT 1/4 bilaterally.  Mild edema lower legs bilaterally. Capillary fill time immediate bilaterally.  Neurological: Decreased vibratory sensation foot bilaterally.  Patchy monofilament sensation loss forefoot and rear foot and lower leg bilaterally.  Dermatologic:   No hair growth in lower extremity.  Normal texture.  Musculoskeletal: Mild hammertoe deformities 2 through 5 bilaterally   Diagnosis: 1.  Neuritis feet bilaterally 2.  Idiopathic peripheral neuropathy bilaterally  Plan: -Step JAP visit for evaluation and management level 3. - Discussed with him peripheral neuropathy.  Discussed the disease process and what happens.  At this point given his given his minimal symptoms and his age and some unsteadiness with gait I prefer not doing any oral medications such as gabapentin or Lyrica at this point.  I think the risk of fall and fatigue outweigh any benefits he would get from the medications.  Sounds like they have already tried a lot of topical medicine such as capsaicin cream and lidocaine  gel.  If he so start getting pain increase then we may need to consider starting with low-dose gabapentin.   Return as needed

## 2024-09-04 ENCOUNTER — Ambulatory Visit

## 2024-09-04 ENCOUNTER — Ambulatory Visit (INDEPENDENT_AMBULATORY_CARE_PROVIDER_SITE_OTHER)

## 2024-09-04 DIAGNOSIS — Z23 Encounter for immunization: Secondary | ICD-10-CM

## 2024-09-04 NOTE — Progress Notes (Signed)
 Patient visits today to receive her FLU HIGH DOSE injection/vaccine. Patient was informed and tolerated well. Patient was notified to reach out to us  if needed.

## 2024-09-19 DIAGNOSIS — C61 Malignant neoplasm of prostate: Secondary | ICD-10-CM | POA: Diagnosis not present

## 2024-09-19 DIAGNOSIS — Z8546 Personal history of malignant neoplasm of prostate: Secondary | ICD-10-CM | POA: Diagnosis not present

## 2024-09-19 DIAGNOSIS — R339 Retention of urine, unspecified: Secondary | ICD-10-CM | POA: Diagnosis not present

## 2024-10-08 NOTE — Progress Notes (Unsigned)
 10/09/2024 Daniel Rowland [id number] January 12, 1935  Referring provider: Norleen Lynwood ORN, MD Primary GI doctor: Dr. San  ASSESSMENT AND PLAN:  Anemia 05/08/2024  HGB 13.7 MCV 86.6 Platelets 257.0 05/08/2024 Iron  101 Ferritin 23.2 B12 534 had dark stool with iron  use but has stopped 3 weeks ago and his stool color is now brown Recent Labs    01/13/24 1143 01/18/24 1317 02/20/24 1015 02/29/24 1359 03/22/24 1141 05/08/24 1354  HGB 12.0* 12.6* 12.6* 12.5* 13.3 13.7  FOBT negative in the office today with surprising amount of harder stool Question possible sterile coral ulcer, partial obstruction/obstruction/malignancy/AVM without overt GI bleeding Long discussion with patient and his wife in regards to proceeding EGD colonoscopy for evaluation of anemia with his age of 18 and severe three-vessel coronary artery disease which is being medically managed due to severity they would prefer to avoid EGD colonoscopy if possible we did discuss potential for malignancy and that we could miss or delay this diagnosis have their they are uncertain what they would do if this was a malignancy. Will proceed with CT abdomen pelvis first Recheck iron  ferritin Consider EGD colonoscopy with cardiac clearance if iron  deficiency continues or CT abnormal.  Diarrhea x 13 years, recent history of Cdiff 2022 CT AB W unremarkable Colon 2003 diverticulosis, 3 mm polyp Flex sig 2005 colitis? 07/24/2024 C. difficile positive had 10 days of vancomycin  Now having intermittent diarrhea and days without BM Surprising amount of stool, has history of self cath Possible neurogenic bladder/overflow Most likely this is overflow incontinence from likely neurogenic bowel in addition to bladder however would we will recheck C. difficile as patient and wife are concerned. FOBT negative which is reassuring - Will get stat KUB today some concern for possible partial obstruction but the symptoms are discussing versus  neurogenic bowel - Will consider bowel purge/enemas - Get CT abdomen pelvis - If there is a colonoscopy due to day prep  GERD On pantoprazole  40 mg daily  Had dark stools with iron  use, got better - Check H. pylori with anemia - Consider EGD  CAD Follows with Dr. Anner 02/24/2024 LHC severe three-vessel disease RCA moderate calcified left main no significant disease, heavily calcified proximal LAD 80-90% mid to apical LAD fills via left-to-right collaterals, large caliber left circumflex low normal EF no aortic valve stenosis.  Optimize medical therapy on aspirin  81 mg daily No chest pain or shortness of breath  Prostate cancer  2013 s/p seeding radiation therapy Now has self catheterization  Patient Care Team: Norleen Lynwood ORN, MD as PCP - General Lavona Lynwood, MD as PCP - Cardiology (Cardiology)  HISTORY OF PRESENT ILLNESS: 89 y.o. male with a past medical history listed below presents for evaluation of iron  deficiency anemia and fecal incontinence.   Discussed the use of AI scribe software for clinical note transcription with the patient, who gave verbal consent to proceed.  Daniel Rowland is an 89 year old male with heart disease and anemia who presents with chronic diarrhea and anemia.  He has experienced chronic diarrhea for the past 13 years, characterized by unpredictable episodes that can occur multiple times a day or not at all for several days. He experiences fecal incontinence, describing it as 'just goes everywhere' and sometimes cannot reach the bathroom in time. No abdominal pain, weight loss, or significant changes in diet or anxiety related to the diarrhea. He uses Imodium to manage symptoms, which provides some relief.  He was diagnosed with Clostridium difficile (C.  diff) infection on August 5th and completed a 10-day course of antibiotics. Despite treatment, he continues to experience intermittent diarrhea. He denies daily diarrhea and reports having days without  bowel movements.  He has a history of anemia and was previously on iron  supplements, which he stopped about three weeks ago due to concerns about black stools potentially caused by the iron . He felt very tired in July, which led him to resume iron  supplements for about a month. He is currently off iron  and reports fatigue returning.  He has a history of heart disease and is on low-dose aspirin . No chest pain or shortness of breath. He has not been on blood thinners.  He experiences upper gastrointestinal symptoms, including heartburn, for which he takes pantoprazole  daily. The medication effectively manages his symptoms, and he denies any trouble swallowing.  He has a history of prostate cancer treated with radiation therapy in 2013, which he initially thought was related to his diarrhea. He performs self-catheterization for bladder management following prostate cancer treatment with radiation therapy in 2013.  No recent weight loss, abdominal pain, or significant bloating, although he reports increased gas and bloating over the past year. No nausea, vomiting, or significant abdominal distension.    He  reports that he has quit smoking. His smoking use included cigarettes. He started smoking about 45 years ago. He has never used smokeless tobacco. He reports that he does not drink alcohol and does not use drugs.  RELEVANT GI HISTORY, IMAGING AND LABS: LABS C. difficile: Positive (07/24/2024)  DIAGNOSTIC Flexible sigmoidoscopy: Erythema, spontaneous hemorrhaging, no necrotic tissue (02/2024) Colonoscopy: Diverticulosis, 3 mm polyp in ascending colon, 3 mm polyp in cecum (11/01/2002) Rectal examination: Negative for blood, large stool present (10/09/2024)  CBC    Component Value Date/Time   WBC 6.0 05/08/2024 1354   RBC 4.75 05/08/2024 1354   HGB 13.7 05/08/2024 1354   HGB 13.3 03/22/2024 1141   HCT 41.2 05/08/2024 1354   HCT 42.0 03/22/2024 1141   PLT 257.0 05/08/2024 1354   PLT 317  03/22/2024 1141   MCV 86.6 05/08/2024 1354   MCV 88 03/22/2024 1141   MCH 27.7 03/22/2024 1141   MCH 29.2 01/13/2024 1143   MCHC 33.3 05/08/2024 1354   RDW 18.4 (H) 05/08/2024 1354   RDW 15.9 (H) 03/22/2024 1141   LYMPHSABS 1.4 05/08/2024 1354   MONOABS 0.8 05/08/2024 1354   EOSABS 0.1 05/08/2024 1354   BASOSABS 0.0 05/08/2024 1354   Recent Labs    01/13/24 1143 01/18/24 1317 02/20/24 1015 02/29/24 1359 03/22/24 1141 05/08/24 1354  HGB 12.0* 12.6* 12.6* 12.5* 13.3 13.7    CMP     Component Value Date/Time   NA 141 05/08/2024 1354   NA 143 03/22/2024 1141   K 4.3 05/08/2024 1354   CL 107 05/08/2024 1354   CO2 25 05/08/2024 1354   GLUCOSE 93 05/08/2024 1354   GLUCOSE 138 (H) 11/16/2006 1117   BUN 16 05/08/2024 1354   BUN 11 03/22/2024 1141   CREATININE 1.16 05/08/2024 1354   CALCIUM  9.0 05/08/2024 1354   PROT 6.9 05/08/2024 1354   ALBUMIN 4.0 05/08/2024 1354   AST 21 05/08/2024 1354   ALT 15 05/08/2024 1354   ALKPHOS 96 05/08/2024 1354   BILITOT 0.5 05/08/2024 1354   GFRNONAA >60 01/13/2024 1143   GFRAA >90 01/30/2012 2100      Latest Ref Rng & Units 05/08/2024    1:54 PM 05/04/2023   11:02 AM 10/06/2022   11:34 AM  Hepatic Function  Total Protein 6.0 - 8.3 g/dL 6.9  6.4  7.0   Albumin 3.5 - 5.2 g/dL 4.0  4.0  4.1   AST 0 - 37 U/L 21  19  17    ALT 0 - 53 U/L 15  16  15    Alk Phosphatase 39 - 117 U/L 96  98  102   Total Bilirubin 0.2 - 1.2 mg/dL 0.5  0.6  0.6   Bilirubin, Direct 0.0 - 0.3 mg/dL 0.1  0.2  0.1       Current Medications:    Current Outpatient Medications (Cardiovascular):    lovastatin  (MEVACOR ) 40 MG tablet, TAKE 1 TABLET BY MOUTH EVERY DAY   metoprolol  succinate (TOPROL -XL) 25 MG 24 hr tablet, Take 1 tablet (25 mg total) by mouth daily.   nitroGLYCERIN  (NITROSTAT ) 0.4 MG SL tablet, Place 1 tablet (0.4 mg total) under the tongue every 5 (five) minutes as needed for chest pain.   Current Outpatient Medications (Analgesics):    aspirin   EC 81 MG tablet, Take 81 mg by mouth daily. Swallow whole.  Current Outpatient Medications (Hematological):    iron  polysaccharides (NU-IRON ) 150 MG capsule, Take 1 capsule (150 mg total) by mouth daily. (Patient not taking: Reported on 10/09/2024)  Current Outpatient Medications (Other):    Multiple Vitamin (MULTIVITAMIN WITH MINERALS) TABS tablet, Take 1 tablet by mouth daily.   pantoprazole  (PROTONIX ) 40 MG tablet, Take 1 tablet (40 mg total) by mouth daily.  Medical History:  Past Medical History:  Diagnosis Date   Allergy    Sulfa   Anal fissure    BENIGN PROSTATIC HYPERTROPHY 02/07/2008   CAD (coronary artery disease)    COLONIC POLYPS, HX OF 02/07/2008   DDD (degenerative disc disease), lumbosacral    L4-5,L5-S1   Diverticulosis 11/01/2002   colonoscopy by Dr Alm Gander   Elevated PSA    Erectile dysfunction    GERD (gastroesophageal reflux disease)    H/O urinary retention 12/21/2012   s/p bx=929cc in bladder,patient taught self catheterization   HYPERLIPIDEMIA 02/07/2008   HYPERTENSION 02/07/2008   no meds   IBS 02/07/2008   Impaired glucose tolerance 02/06/2012   Intermittent self-catheterization of bladder    Lumbar spondylosis    lower   PEPTIC ULCER DISEASE 02/07/2008   Pre-diabetes    Prostate cancer (HCC) 12/21/2012   Adenocarcinoma,gleason=3+4=7,PSA=36.99,   Schatzki's ring    Stenosis of right carotid artery 10/16/2014   TIA (transient ischemic attack)    15 years ago   TRANSIENT ISCHEMIC ATTACK, HX OF 02/07/2008   Umbilical hernia    Use of leuprolide acetate (Lupron) 01/16/2013   injection   Allergies:  Allergies  Allergen Reactions   Sulfa Antibiotics Hives   Nitrofuran Derivatives Rash     Surgical History:  He  has a past surgical history that includes Arm wound repair / closure (Left); Prostate biopsy (12/21/2012); Appendectomy; Dupuytren contracture release (Bilateral); and LEFT HEART CATH AND CORONARY ANGIOGRAPHY (N/A,  02/24/2024). Family History:  His family history includes Bone cancer in his father; Breast cancer in his daughter; Colon cancer in his daughter; Depression in his brother; Diabetes in his brother; Gallbladder disease in his mother; Heart disease (age of onset: 50) in his mother; Peptic Ulcer in his father; Prostate cancer in his father; Thyroid  disease in his daughter.  REVIEW OF SYSTEMS  : All other systems reviewed and negative except where noted in the History of Present Illness.  PHYSICAL EXAM: BP 130/60 (BP Location: Left  Arm, Patient Position: Sitting, Cuff Size: Normal)   Pulse (!) 54   Ht 5' 6 (1.676 m) Comment: height measured without shoes  Wt 195 lb (88.5 kg)   BMI 31.47 kg/m  GENERAL APPEARANCE: Well nourished, in no apparent distress. HEENT: No cervical lymphadenopathy, unremarkable thyroid , sclerae anicteric, conjunctiva pink. RESPIRATORY: Respiratory effort normal, BS equal bilateral without rales, rhonchi, wheezing. CARDIO: RRR with no MRGs, peripheral pulses intact. ABDOMEN: Soft, non distended, active bowel sounds in all 4 quadrants, non-tender, no rebound, no mass appreciated. RECTAL: No hemorrhoids or fissures, rectal exam negative for blood, large amount of stool in rectum. MUSCULOSKELETAL: Full ROM, normal gait, without edema. SKIN: Dry, intact without rashes or lesions. No jaundice. NEURO: Alert, oriented, no focal deficits. PSYCH: Cooperative, normal mood and affect.  Alan JONELLE Coombs, PA-C 12:05 PM

## 2024-10-09 ENCOUNTER — Other Ambulatory Visit (INDEPENDENT_AMBULATORY_CARE_PROVIDER_SITE_OTHER)

## 2024-10-09 ENCOUNTER — Encounter: Payer: Self-pay | Admitting: Physician Assistant

## 2024-10-09 ENCOUNTER — Ambulatory Visit (INDEPENDENT_AMBULATORY_CARE_PROVIDER_SITE_OTHER)
Admission: RE | Admit: 2024-10-09 | Discharge: 2024-10-09 | Disposition: A | Discharge status: Home / Self Care | Source: Ambulatory Visit | Attending: Physician Assistant | Admitting: Physician Assistant

## 2024-10-09 ENCOUNTER — Ambulatory Visit: Admitting: Physician Assistant

## 2024-10-09 VITALS — BP 130/60 | HR 54 | Ht 66.0 in | Wt 195.0 lb

## 2024-10-09 DIAGNOSIS — K219 Gastro-esophageal reflux disease without esophagitis: Secondary | ICD-10-CM | POA: Diagnosis not present

## 2024-10-09 DIAGNOSIS — D509 Iron deficiency anemia, unspecified: Secondary | ICD-10-CM

## 2024-10-09 DIAGNOSIS — R159 Full incontinence of feces: Secondary | ICD-10-CM | POA: Diagnosis not present

## 2024-10-09 DIAGNOSIS — D649 Anemia, unspecified: Secondary | ICD-10-CM | POA: Diagnosis not present

## 2024-10-09 DIAGNOSIS — R197 Diarrhea, unspecified: Secondary | ICD-10-CM

## 2024-10-09 DIAGNOSIS — A0472 Enterocolitis due to Clostridium difficile, not specified as recurrent: Secondary | ICD-10-CM

## 2024-10-09 DIAGNOSIS — R110 Nausea: Secondary | ICD-10-CM | POA: Diagnosis not present

## 2024-10-09 DIAGNOSIS — K529 Noninfective gastroenteritis and colitis, unspecified: Secondary | ICD-10-CM | POA: Diagnosis not present

## 2024-10-09 LAB — CBC WITH DIFFERENTIAL/PLATELET
Basophils Absolute: 0 K/uL (ref 0.0–0.1)
Basophils Relative: 0.7 % (ref 0.0–3.0)
Eosinophils Absolute: 0.1 K/uL (ref 0.0–0.7)
Eosinophils Relative: 2.5 % (ref 0.0–5.0)
HCT: 43.5 % (ref 39.0–52.0)
Hemoglobin: 14.5 g/dL (ref 13.0–17.0)
Lymphocytes Relative: 22.1 % (ref 12.0–46.0)
Lymphs Abs: 1 K/uL (ref 0.7–4.0)
MCHC: 33.3 g/dL (ref 30.0–36.0)
MCV: 93.1 fl (ref 78.0–100.0)
Monocytes Absolute: 0.6 K/uL (ref 0.1–1.0)
Monocytes Relative: 12.8 % — ABNORMAL HIGH (ref 3.0–12.0)
Neutro Abs: 2.9 K/uL (ref 1.4–7.7)
Neutrophils Relative %: 61.9 % (ref 43.0–77.0)
Platelets: 253 K/uL (ref 150.0–400.0)
RBC: 4.67 Mil/uL (ref 4.22–5.81)
RDW: 13.8 % (ref 11.5–15.5)
WBC: 4.7 K/uL (ref 4.0–10.5)

## 2024-10-09 LAB — IBC + FERRITIN
Ferritin: 33.7 ng/mL (ref 22.0–322.0)
Iron: 83 ug/dL (ref 42–165)
Saturation Ratios: 27.2 % (ref 20.0–50.0)
TIBC: 305.2 ug/dL (ref 250.0–450.0)
Transferrin: 218 mg/dL (ref 212.0–360.0)

## 2024-10-09 LAB — COMPREHENSIVE METABOLIC PANEL WITH GFR
ALT: 16 U/L (ref 0–53)
AST: 20 U/L (ref 0–37)
Albumin: 4.2 g/dL (ref 3.5–5.2)
Alkaline Phosphatase: 96 U/L (ref 39–117)
BUN: 15 mg/dL (ref 6–23)
CO2: 27 meq/L (ref 19–32)
Calcium: 9.1 mg/dL (ref 8.4–10.5)
Chloride: 107 meq/L (ref 96–112)
Creatinine, Ser: 1.09 mg/dL (ref 0.40–1.50)
GFR: 61.31 mL/min (ref >60.00)
Glucose, Bld: 118 mg/dL — ABNORMAL HIGH (ref 70–99)
Potassium: 5 meq/L (ref 3.5–5.1)
Sodium: 142 meq/L (ref 135–145)
Total Bilirubin: 0.6 mg/dL (ref 0.2–1.2)
Total Protein: 7 g/dL (ref 6.0–8.3)

## 2024-10-09 LAB — SEDIMENTATION RATE: Sed Rate: 3 mm/h (ref 0–20)

## 2024-10-09 NOTE — Patient Instructions (Signed)
 Your provider has requested that you go to the basement level for lab work before leaving today. Press B on the elevator. The lab is located at the first door on the left as you exit the elevator.  Please go get an X-ray when you are down in the basement.  Your provider has ordered [employer] stool testing for you. You have received a kit from our office today containing all necessary supplies to complete this test. Please carefully read the stool collection instructions provided in the kit before opening the accompanying materials. In addition, be sure there is a label providing your full name and date of birth on the puritan opti-swab tube that is supplied in the kit (if you do not see a label with this information on your test tube, please make us  aware before test collection!). After completing the test, you should secure the purtian tube into the specimen biohazard bag. The [facility] E-Req sheet (including date and time of specimen collection) should be placed into the outside pocket of the specimen biohazard bag and returned to the [facility] [facility] lab (basement floor of [facility]) within 3 days of collection. Please make sure to give the specimen to a staff member at the lab. DO NOT leave the specimen on the counter.   If the specimen date and time (can be found in the upper right boxed portion of the sheet) are not filled out on the E-Req sheet, the test will NOT be performed.    You have been scheduled for a CT scan of the abdomen and pelvis at [facility], 1st floor Radiology. You are scheduled on 10/16/2024 at 3:00pm. You should arrive 15 minutes prior to your appointment time for registration.   You may take any medications as prescribed with a small amount of water, if necessary. If you take any of the following medications: METFORMIN, GLUCOPHAGE, GLUCOVANCE, AVANDAMET, RIOMET, FORTAMET, ACTOPLUS MET, JANUMET, GLUMETZA or METAGLIP, you MAY be asked to  HOLD this medication 48 hours AFTER the exam.    If you have any questions regarding your exam or if you need to reschedule, you may call [facility] at [phone] between the hours of 8:00 am and 5:00 pm, Monday-Friday.

## 2024-10-10 ENCOUNTER — Telehealth: Payer: Self-pay | Admitting: Gastroenterology

## 2024-10-10 ENCOUNTER — Ambulatory Visit: Payer: Self-pay | Admitting: Physician Assistant

## 2024-10-10 DIAGNOSIS — D509 Iron deficiency anemia, unspecified: Secondary | ICD-10-CM

## 2024-10-10 DIAGNOSIS — K449 Diaphragmatic hernia without obstruction or gangrene: Secondary | ICD-10-CM

## 2024-10-10 NOTE — Telephone Encounter (Signed)
 Received a call from [employer] after hours about this patient's stool test which was positive for C. difficile PCR.  I tried calling his home phone number, it appeared to be answered but then hung up on my multiple times.  I called multiple times and could not get through.  I called his cell phone number, not able to leave a message.  I am recommending a course of Dificid 200 mg twice daily for 10 days given he had C. difficile back in August.  His recent CBC showed a normal WBC, sounds like the symptoms have been persistent for some time.  I tried my best to get a hold of this patient at this hour but no success  Amanda - FYI, C. difficile positive.  I will send to pod B RN - can you please try to get a hold of this patient in order course of Dificid if covered by insurance?  Thank you

## 2024-10-11 ENCOUNTER — Other Ambulatory Visit (HOSPITAL_COMMUNITY): Payer: Self-pay

## 2024-10-11 ENCOUNTER — Other Ambulatory Visit (HOSPITAL_BASED_OUTPATIENT_CLINIC_OR_DEPARTMENT_OTHER): Payer: Self-pay

## 2024-10-11 MED ORDER — FIDAXOMICIN 200 MG PO TABS
200.0000 mg | ORAL_TABLET | Freq: Two times a day (BID) | ORAL | 0 refills | Status: AC
  Filled 2024-10-11: qty 20, 10d supply, fill #0

## 2024-10-11 NOTE — Telephone Encounter (Signed)
 Attempted to reach patient at listed phone number. Phone line is picked up, no one speaks and then call automatically ends. MyChart message sent to patient/spouse notifying them of results & C. Diff precautions while we await PA for Dificid.

## 2024-10-11 NOTE — Telephone Encounter (Signed)
 Called & spoke with patient's wife regarding positive C. Diff results. Reviewed C. Diff precautions with patient's wife. Mrs. Duffus knows to expect a call from [employer] patient assistance to set up shipment of Dificid prescription for patient. They will hold off on bowel purge until after treatment of C. Diff. Mrs. Dyal is aware that results & recommendations have been sent to MyChart as well. Mrs. Terrio verbalized understanding & had no concerns at the end of the call.

## 2024-10-11 NOTE — Telephone Encounter (Signed)
 Can we submit PA for Dificid ASAP? Thanks

## 2024-10-11 NOTE — Progress Notes (Signed)
 Agree with the assessment and plan as outlined by Quentin Mulling, PA-C. ? ?Keron Neenan, DO, FACG ? ?

## 2024-10-12 ENCOUNTER — Telehealth: Payer: Self-pay | Admitting: Physician Assistant

## 2024-10-12 ENCOUNTER — Other Ambulatory Visit (HOSPITAL_COMMUNITY): Payer: Self-pay

## 2024-10-12 NOTE — Telephone Encounter (Signed)
 Inbound call from [facility] stating patients copay for dificid medication is $1700, pharmacist would like to know if medication can be sent to specialty pharmacy. They will hold onto medication  Please advise  Thank you

## 2024-10-12 NOTE — Telephone Encounter (Signed)
 Returned call to pharmacist at [facility] & made them aware that [place], Charity fundraiser and [place] team has already contacted [facility] yesterday (refer to alternate phone note) and [employer] will contact patient for assistance.

## 2024-10-15 NOTE — Telephone Encounter (Signed)
 Inbound call from patient wife wanting to follow up on what she is needs to do or if patient is needing to have more medication based off previous message below. Please advise.

## 2024-10-16 ENCOUNTER — Ambulatory Visit (HOSPITAL_COMMUNITY)
Admission: RE | Admit: 2024-10-16 | Discharge: 2024-10-16 | Disposition: A | Discharge status: Home / Self Care | Source: Ambulatory Visit | Attending: Physician Assistant | Admitting: Physician Assistant

## 2024-10-16 DIAGNOSIS — K5730 Diverticulosis of large intestine without perforation or abscess without bleeding: Secondary | ICD-10-CM | POA: Insufficient documentation

## 2024-10-16 DIAGNOSIS — K4020 Bilateral inguinal hernia, without obstruction or gangrene, not specified as recurrent: Secondary | ICD-10-CM | POA: Diagnosis not present

## 2024-10-16 DIAGNOSIS — R159 Full incontinence of feces: Secondary | ICD-10-CM | POA: Diagnosis not present

## 2024-10-16 DIAGNOSIS — K429 Umbilical hernia without obstruction or gangrene: Secondary | ICD-10-CM | POA: Diagnosis not present

## 2024-10-16 DIAGNOSIS — K449 Diaphragmatic hernia without obstruction or gangrene: Secondary | ICD-10-CM | POA: Diagnosis not present

## 2024-10-16 DIAGNOSIS — A0472 Enterocolitis due to Clostridium difficile, not specified as recurrent: Secondary | ICD-10-CM | POA: Insufficient documentation

## 2024-10-16 DIAGNOSIS — D509 Iron deficiency anemia, unspecified: Secondary | ICD-10-CM | POA: Insufficient documentation

## 2024-10-16 DIAGNOSIS — R110 Nausea: Secondary | ICD-10-CM | POA: Insufficient documentation

## 2024-10-16 DIAGNOSIS — N289 Disorder of kidney and ureter, unspecified: Secondary | ICD-10-CM | POA: Diagnosis not present

## 2024-10-16 MED ORDER — SODIUM CHLORIDE (PF) 0.9 % IJ SOLN
INTRAMUSCULAR | Status: AC
  Filled 2024-10-16: qty 50

## 2024-10-16 MED ORDER — IOHEXOL 300 MG/ML  SOLN
100.0000 mL | Freq: Once | INTRAMUSCULAR | Status: AC | PRN
  Administered 2024-10-16: 100 mL via INTRAVENOUS

## 2024-10-18 MED ORDER — SUCRALFATE 1 G PO TABS: 1.0000 g | ORAL_TABLET | Freq: Three times a day (TID) | ORAL | 0 refills | Status: DC

## 2024-10-18 MED ORDER — FAMOTIDINE 40 MG PO TABS: 40.0000 mg | ORAL_TABLET | Freq: Every day | ORAL | 3 refills | Status: DC

## 2024-10-18 NOTE — Telephone Encounter (Signed)
 I never received a call from [employer]. I just got off of the phone with Steffan & they still have not received the form. He wanted to confirm the fax # that pharmacy team was sending it to, I did not have that. He said that it needs to go to [phone]. I have sent a secure message to [facility], [place] & she is following up on this.

## 2024-10-18 NOTE — Telephone Encounter (Signed)
 Per Rosina: He was approved and med was sent 10-28 and delivered 10-29 so he should have medication.

## 2024-10-18 NOTE — Telephone Encounter (Signed)
 I contacted [employer] and it doesn't look like the forms were received. I am resending them now and they should be contacting the main office [phone]) within 20 minutes of them receiving it.

## 2024-10-18 NOTE — Telephone Encounter (Signed)
 Cdiff positive treated, H pylori negative diatherix.

## 2024-10-18 NOTE — Telephone Encounter (Signed)
 Pharmacy Patient Advocate Enoucnter  Spoke with Harlene at [facility] and she confirmed that the patient was approved for Dificid coverage. Effective dates of 10-12-2024 to 10-11-2025. Medication was shipped on 10-16-2024 and delivered on 10-17-2024

## 2024-10-18 NOTE — Telephone Encounter (Signed)
 Returned call to patient's wife Daniel Rowland. Left detailed vm letting her know that [employer] should have reached out the same day that we spoke. I advised Daniel Rowland to call [employer] Patient [employer] Program at [phone] directly to follow up on prescription and set up shipment.

## 2024-10-18 NOTE — Telephone Encounter (Signed)
 At the time of application, [employer] should have contacted office for follow up and then patient to set up for delivery to his home. Has the patient not received? They call same day

## 2024-10-20 DIAGNOSIS — C61 Malignant neoplasm of prostate: Secondary | ICD-10-CM | POA: Diagnosis not present

## 2024-10-20 DIAGNOSIS — Z8546 Personal history of malignant neoplasm of prostate: Secondary | ICD-10-CM | POA: Diagnosis not present

## 2024-10-20 DIAGNOSIS — R339 Retention of urine, unspecified: Secondary | ICD-10-CM | POA: Diagnosis not present

## 2024-10-29 ENCOUNTER — Encounter: Payer: Self-pay | Admitting: Physician Assistant

## 2024-10-30 ENCOUNTER — Telehealth: Payer: Self-pay | Admitting: Physician Assistant

## 2024-10-30 NOTE — Telephone Encounter (Signed)
 Inbound call form patient wife stating that her husband finished his C-diff medication and on Saturday he had some diarrhea. Patient wife is wanting to know if he is going to need more antibiotics due to them leaving on Monday of next week out of town. Please advise.

## 2024-10-31 DIAGNOSIS — M25512 Pain in left shoulder: Secondary | ICD-10-CM | POA: Diagnosis not present

## 2024-10-31 DIAGNOSIS — G8929 Other chronic pain: Secondary | ICD-10-CM | POA: Diagnosis not present

## 2024-10-31 NOTE — Telephone Encounter (Signed)
 Spoke with patient's wife Merlynn. Patient finished abx on Saturday. Merlynn reports that patient had just one big blowout, liquid. Patient has not had a BM today. Patient and wife are leaving for [place]  on Monday and will be gone 5 days. Merlynn is wanting to know if patient needs another prescription of Dificid on hand for their trip or is there another medication that can be prescribed? Please advise, thanks.

## 2024-10-31 NOTE — Telephone Encounter (Signed)
 Inbound call from patient wife stating that she was returning a call back to [place]. Patient wife is requesting a call back. Please advise.

## 2024-10-31 NOTE — Telephone Encounter (Signed)
 Lm on vm for patient's wife to return call to discuss patient's current symptoms further and to clarify when he completed Dificid. Myles Rung to return call or send information via MyChart.

## 2024-11-01 ENCOUNTER — Other Ambulatory Visit: Payer: Self-pay

## 2024-11-01 DIAGNOSIS — Z8619 Personal history of other infectious and parasitic diseases: Secondary | ICD-10-CM

## 2024-11-01 DIAGNOSIS — R197 Diarrhea, unspecified: Secondary | ICD-10-CM

## 2024-11-01 NOTE — Telephone Encounter (Signed)
 Called and spoke with Mrs. Merlynn regarding Amanda's recommendations. Mrs. Merlynn will stop by to pick up stool kit today and they will return ASAP. Mrs. Merlynn is aware that if results are positive we can send prescription to where they are vacationing. In the interim Mrs. Merlynn will get Benefiber and review MyChart message with diet recommendations including avoiding milk/lactose products. Mrs. Merlynn stated that the probiotics made Mr. Carneal have more diarrhea. Mrs. Merlynn is aware that I have sent all recommendations to MyChart as well. Mrs. Merlynn verbalized understanding and had no concerns at the ned of the call.

## 2024-11-03 DIAGNOSIS — R197 Diarrhea, unspecified: Secondary | ICD-10-CM | POA: Diagnosis not present

## 2024-11-03 DIAGNOSIS — Z8619 Personal history of other infectious and parasitic diseases: Secondary | ICD-10-CM | POA: Diagnosis not present

## 2024-11-06 ENCOUNTER — Encounter: Payer: Self-pay | Admitting: Internal Medicine

## 2024-11-06 ENCOUNTER — Telehealth: Payer: Self-pay | Admitting: Internal Medicine

## 2024-11-06 DIAGNOSIS — Z8619 Personal history of other infectious and parasitic diseases: Secondary | ICD-10-CM

## 2024-11-06 NOTE — Telephone Encounter (Addendum)
 Diatherex C diff test is +  Patient has been treated for C diff at least twice  Have to wonder if this is not IBS w/ persistent C diff + PCR  He may need Antigen and toxin test vs expectant management

## 2024-11-06 NOTE — Progress Notes (Unsigned)
 Lab abnormal C diff  C diff +  Reviewed notes and not having constant diarrhea will defer treatment to MD and PA

## 2024-11-07 NOTE — Telephone Encounter (Signed)
 Called & spoke with patient's wife Daniel Rowland regarding + C. Diff results. Advised that we have ordered an additional stool test to check for active infection vs colonization. Mrs. Daniel Rowland informed me that they are in [place]  until Saturday and will pick up kit on Monday. I did reinforce C. Diff precautions until we are sure about additional stool test result. Mrs. Daniel Rowland verbalized understanding and had no concerns at the end of the call.

## 2024-11-12 ENCOUNTER — Other Ambulatory Visit

## 2024-11-12 DIAGNOSIS — Z8619 Personal history of other infectious and parasitic diseases: Secondary | ICD-10-CM | POA: Diagnosis not present

## 2024-11-13 ENCOUNTER — Ambulatory Visit: Payer: Self-pay | Admitting: Physician Assistant

## 2024-11-13 LAB — C. DIFFICILE GDH AND TOXIN A/B
GDH ANTIGEN: NOT DETECTED
MICRO NUMBER:: 17275221
SPECIMEN QUALITY:: ADEQUATE
TOXIN A AND B: NOT DETECTED

## 2024-11-19 DIAGNOSIS — Z8546 Personal history of malignant neoplasm of prostate: Secondary | ICD-10-CM | POA: Diagnosis not present

## 2024-11-19 DIAGNOSIS — C61 Malignant neoplasm of prostate: Secondary | ICD-10-CM | POA: Diagnosis not present

## 2024-11-19 DIAGNOSIS — R339 Retention of urine, unspecified: Secondary | ICD-10-CM | POA: Diagnosis not present

## 2024-11-21 NOTE — Progress Notes (Unsigned)
 11/21/2024 Daniel Rowland [id number] 1935/12/11  Referring provider: Norleen Lynwood ORN, MD Primary GI doctor: Dr. San  ASSESSMENT AND PLAN:  Anemia 05/08/2024  HGB 13.7 MCV 86.6 Platelets 257.0 05/08/2024 Iron  101 Ferritin 23.2 B12 534 10/09/2024  HGB 14.5 MCV 93.1 Platelets 253.0 10/09/2024 Iron  83 Ferritin 33.7 B12 534 Recent Labs    01/13/24 1143 01/18/24 1317 02/20/24 1015 02/29/24 1359 03/22/24 1141 05/08/24 1354 10/09/24 1209  HGB 12.0* 12.6* 12.6* 12.5* 13.3 13.7 14.5  2025 negative H. pylori Diatherix Denies overt GI bleeding, negative FOBT in the office He is not on an iron  supplement and had normal iron  last OV in Oct Question possible sterile coral ulcer, partial obstruction/obstruction/malignancy/AVM without overt GI bleeding, carmeron lesions 10/16/2024 CTAP W interval increase in size moderate to large mixed type hiatal hernia with fluid distention no bowel dilatation or wall thickening, moderate umbilical hernia containing fat and free fluid, moderate left and small right fat-containing inguinal hernias, colonic diverticulosis without diverticulitis - recheck iron , ferritin, CBC ( off iron ) - consider increasing PPI to BID -Consider EGD colonoscopy with cardiac clearance if iron  deficiency continues, declines evaluation at this time  Diarrhea x 13 years, recent history of Cdiff 2025 CT AB W unremarkable Colon 2003 diverticulosis, 3 mm polyp Flex sig 2005 colitis? 07/24/2024 C. difficile positive had 10 days of vancomycin  10/09/2024 Diatherix positive for C. difficile treated with Dificid  10/28/2024 C. difficile with GDH negative likely false positive 10/09/2024 KUB normal bowel gas pattern small volume of formed colonic stool ascending proximal transverse colon, low pelvis including rectum Possible neurogenic bladder/overflow FOBT negative which is reassuring -bowel purge then - can do trial of linzess 145 mcg with fiber after bowel purge - consider with  or without dulcolax/senocot may need for stimulant - can consider enemas prior to functions/travel etc  GERD large hiatal hernia seen on CT On pantoprazole  40 mg daily  Had dark stools with iron  use, got better H. pylori negative - consider increasing pantoprazole  to 40 mg TWICE a day but no symptoms at this time - Consider EGD  CAD Follows with Dr. Anner 02/24/2024 LHC severe three-vessel disease RCA moderate calcified left main no significant disease, heavily calcified proximal LAD 80-90% mid to apical LAD fills via left-to-right collaterals, large caliber left circumflex low normal EF no aortic valve stenosis.  Optimize medical therapy on aspirin  81 mg daily No chest pain or shortness of breath  Prostate cancer  2013 s/p seeding radiation therapy Now has self catheterization  Patient Care Team: Norleen Lynwood ORN, MD as PCP - General Lavona Lynwood, MD as PCP - Cardiology (Cardiology)  HISTORY OF PRESENT ILLNESS: 89 y.o. male with a past medical history listed below presents for evaluation of iron  deficiency anemia and fecal incontinence.   I last saw the patient in the office 10/09/2024 for fecal incontinence and iron  deficiency anemia.  Discussed the use of AI scribe software for clinical note transcription with the patient, who gave verbal consent to proceed.  History of Present Illness   Daniel Rowland is an 89 year old male who presents for follow-up of gastrointestinal symptoms.  He has a history of anemia, previously improved without iron  supplementation. He is currently not on iron  supplements, and his anemia is being monitored.  He has a history of Clostridioides difficile infection, with past positive tests. A recent test on November 9th was negative. Previous symptoms of diarrhea, nausea, and abdominal cramping have resolved.  He has a moderate to large  hiatal hernia identified on a CT scan in October. He currently experiences phlegm and pain but no heartburn, nausea, or  abdominal discomfort. He is on pantoprazole  for reflux management.  He experiences bowel movement irregularities, described as 'bad, bad, bad' blowouts, especially after taking Imodium for a week during a trip to [place]. He has a neurogenic bladder requiring catheterization and similar bowel issues, with a sensation of fullness and occasional watery stools. Past use of fiber supplements resulted in diarrhea, and he dislikes water. He has diverticulosis.  No recent episodes of abdominal pain or nausea. He hasn't been nauseated in three to four months. Previous episodes of feeling 'sick on his stomach' occurred sporadically over four to five months but have since resolved.       He  reports that he has quit smoking. His smoking use included cigarettes. He started smoking about 45 years ago. He has never used smokeless tobacco. He reports that he does not drink alcohol and does not use drugs.  RELEVANT GI HISTORY, IMAGING AND LABS: LABS C. difficile: Positive (07/24/2024)  DIAGNOSTIC Flexible sigmoidoscopy: Erythema, spontaneous hemorrhaging, no necrotic tissue (02/2024) Colonoscopy: Diverticulosis, 3 mm polyp in ascending colon, 3 mm polyp in cecum (11/01/2002) Rectal examination: Negative for blood, large stool present (10/09/2024)  CBC    Component Value Date/Time   WBC 4.7 10/09/2024 1209   RBC 4.67 10/09/2024 1209   HGB 14.5 10/09/2024 1209   HGB 13.3 03/22/2024 1141   HCT 43.5 10/09/2024 1209   HCT 42.0 03/22/2024 1141   PLT 253.0 10/09/2024 1209   PLT 317 03/22/2024 1141   MCV 93.1 10/09/2024 1209   MCV 88 03/22/2024 1141   MCH 27.7 03/22/2024 1141   MCH 29.2 01/13/2024 1143   MCHC 33.3 10/09/2024 1209   RDW 13.8 10/09/2024 1209   RDW 15.9 (H) 03/22/2024 1141   LYMPHSABS 1.0 10/09/2024 1209   MONOABS 0.6 10/09/2024 1209   EOSABS 0.1 10/09/2024 1209   BASOSABS 0.0 10/09/2024 1209   Recent Labs    01/13/24 1143 01/18/24 1317 02/20/24 1015 02/29/24 1359 03/22/24 1141  05/08/24 1354 10/09/24 1209  HGB 12.0* 12.6* 12.6* 12.5* 13.3 13.7 14.5    CMP     Component Value Date/Time   NA 142 10/09/2024 1209   NA 143 03/22/2024 1141   K 5.0 10/09/2024 1209   CL 107 10/09/2024 1209   CO2 27 10/09/2024 1209   GLUCOSE 118 (H) 10/09/2024 1209   GLUCOSE 138 (H) 11/16/2006 1117   BUN 15 10/09/2024 1209   BUN 11 03/22/2024 1141   CREATININE 1.09 10/09/2024 1209   CALCIUM  9.1 10/09/2024 1209   PROT 7.0 10/09/2024 1209   ALBUMIN 4.2 10/09/2024 1209   AST 20 10/09/2024 1209   ALT 16 10/09/2024 1209   ALKPHOS 96 10/09/2024 1209   BILITOT 0.6 10/09/2024 1209   GFRNONAA >60 01/13/2024 1143   GFRAA >90 01/30/2012 2100      Latest Ref Rng & Units 10/09/2024   12:09 PM 05/08/2024    1:54 PM 05/04/2023   11:02 AM  Hepatic Function  Total Protein 6.0 - 8.3 g/dL 7.0  6.9  6.4   Albumin 3.5 - 5.2 g/dL 4.2  4.0  4.0   AST 0 - 37 U/L 20  21  19    ALT 0 - 53 U/L 16  15  16    Alk Phosphatase 39 - 117 U/L 96  96  98   Total Bilirubin 0.2 - 1.2 mg/dL 0.6  0.5  0.6   Bilirubin, Direct 0.0 - 0.3 mg/dL  0.1  0.2       Current Medications:    Current Outpatient Medications (Cardiovascular):    lovastatin  (MEVACOR ) 40 MG tablet, TAKE 1 TABLET BY MOUTH EVERY DAY   metoprolol  succinate (TOPROL -XL) 25 MG 24 hr tablet, Take 1 tablet (25 mg total) by mouth daily.   nitroGLYCERIN  (NITROSTAT ) 0.4 MG SL tablet, Place 1 tablet (0.4 mg total) under the tongue every 5 (five) minutes as needed for chest pain.   Current Outpatient Medications (Analgesics):    aspirin  EC 81 MG tablet, Take 81 mg by mouth daily. Swallow whole.  Current Outpatient Medications (Hematological):    iron  polysaccharides (NU-IRON ) 150 MG capsule, Take 1 capsule (150 mg total) by mouth daily. (Patient not taking: Reported on 10/09/2024)  Current Outpatient Medications (Other):    famotidine  (PEPCID ) 40 MG tablet, Take 1 tablet (40 mg total) by mouth at bedtime.   Multiple Vitamin (MULTIVITAMIN WITH  MINERALS) TABS tablet, Take 1 tablet by mouth daily.   pantoprazole  (PROTONIX ) 40 MG tablet, Take 1 tablet (40 mg total) by mouth daily.   sucralfate  (CARAFATE ) 1 g tablet, Take 1 tablet (1 g total) by mouth 4 (four) times daily -  with meals and at bedtime for 10 days.  Medical History:  Past Medical History:  Diagnosis Date   Allergy    Sulfa   Anal fissure    BENIGN PROSTATIC HYPERTROPHY 02/07/2008   CAD (coronary artery disease)    COLONIC POLYPS, HX OF 02/07/2008   DDD (degenerative disc disease), lumbosacral    L4-5,L5-S1   Diverticulosis 11/01/2002   colonoscopy by Dr Alm Gander   Elevated PSA    Erectile dysfunction    GERD (gastroesophageal reflux disease)    H/O urinary retention 12/21/2012   s/p bx=929cc in bladder,patient taught self catheterization   HYPERLIPIDEMIA 02/07/2008   HYPERTENSION 02/07/2008   no meds   IBS 02/07/2008   Impaired glucose tolerance 02/06/2012   Intermittent self-catheterization of bladder    Lumbar spondylosis    lower   PEPTIC ULCER DISEASE 02/07/2008   Pre-diabetes    Prostate cancer (HCC) 12/21/2012   Adenocarcinoma,gleason=3+4=7,PSA=36.99,   Schatzki's ring    Stenosis of right carotid artery 10/16/2014   TIA (transient ischemic attack)    15 years ago   TRANSIENT ISCHEMIC ATTACK, HX OF 02/07/2008   Umbilical hernia    Use of leuprolide acetate (Lupron) 01/16/2013   injection   Allergies:  Allergies  Allergen Reactions   Sulfa Antibiotics Hives   Nitrofuran Derivatives Rash     Surgical History:  He  has a past surgical history that includes Arm wound repair / closure (Left); Prostate biopsy (12/21/2012); Appendectomy; Dupuytren contracture release (Bilateral); and LEFT HEART CATH AND CORONARY ANGIOGRAPHY (N/A, 02/24/2024). Family History:  His family history includes Bone cancer in his father; Breast cancer in his daughter; Colon cancer in his daughter; Depression in his brother; Diabetes in his brother; Gallbladder  disease in his mother; Heart disease (age of onset: 72) in his mother; Peptic Ulcer in his father; Prostate cancer in his father; Thyroid  disease in his daughter.  REVIEW OF SYSTEMS  : All other systems reviewed and negative except where noted in the History of Present Illness.  PHYSICAL EXAM: There were no vitals taken for this visit. GENERAL APPEARANCE: Well nourished, in no apparent distress. HEENT: No cervical lymphadenopathy, unremarkable thyroid , sclerae anicteric, conjunctiva pink. RESPIRATORY: Respiratory effort normal, BS equal bilateral without rales,  rhonchi, wheezing. CARDIO: RRR with no MRGs, peripheral pulses intact. ABDOMEN: Soft, non distended, active bowel sounds in all 4 quadrants, non-tender, no rebound, no mass appreciated. RECTAL: No hemorrhoids or fissures, rectal exam negative for blood, large amount of stool in rectum. MUSCULOSKELETAL: Full ROM, normal gait, without edema. SKIN: Dry, intact without rashes or lesions. No jaundice. NEURO: Alert, oriented, no focal deficits. PSYCH: Cooperative, normal mood and affect.  Daniel JONELLE Coombs, PA-C 12:28 PM

## 2024-11-22 ENCOUNTER — Encounter: Payer: Self-pay | Admitting: Physician Assistant

## 2024-11-22 ENCOUNTER — Ambulatory Visit: Admitting: Physician Assistant

## 2024-11-22 ENCOUNTER — Other Ambulatory Visit

## 2024-11-22 ENCOUNTER — Ambulatory Visit: Payer: Self-pay | Admitting: Physician Assistant

## 2024-11-22 VITALS — BP 130/60 | HR 68 | Ht 66.0 in | Wt 196.2 lb

## 2024-11-22 DIAGNOSIS — K429 Umbilical hernia without obstruction or gangrene: Secondary | ICD-10-CM | POA: Diagnosis not present

## 2024-11-22 DIAGNOSIS — D509 Iron deficiency anemia, unspecified: Secondary | ICD-10-CM

## 2024-11-22 DIAGNOSIS — Z8619 Personal history of other infectious and parasitic diseases: Secondary | ICD-10-CM | POA: Diagnosis not present

## 2024-11-22 DIAGNOSIS — K219 Gastro-esophageal reflux disease without esophagitis: Secondary | ICD-10-CM | POA: Diagnosis not present

## 2024-11-22 DIAGNOSIS — K5730 Diverticulosis of large intestine without perforation or abscess without bleeding: Secondary | ICD-10-CM | POA: Diagnosis not present

## 2024-11-22 DIAGNOSIS — R110 Nausea: Secondary | ICD-10-CM

## 2024-11-22 DIAGNOSIS — K449 Diaphragmatic hernia without obstruction or gangrene: Secondary | ICD-10-CM

## 2024-11-22 DIAGNOSIS — R197 Diarrhea, unspecified: Secondary | ICD-10-CM | POA: Diagnosis not present

## 2024-11-22 DIAGNOSIS — D649 Anemia, unspecified: Secondary | ICD-10-CM | POA: Diagnosis not present

## 2024-11-22 DIAGNOSIS — K4090 Unilateral inguinal hernia, without obstruction or gangrene, not specified as recurrent: Secondary | ICD-10-CM | POA: Diagnosis not present

## 2024-11-22 DIAGNOSIS — N319 Neuromuscular dysfunction of bladder, unspecified: Secondary | ICD-10-CM

## 2024-11-22 DIAGNOSIS — R159 Full incontinence of feces: Secondary | ICD-10-CM

## 2024-11-22 LAB — CBC WITH DIFFERENTIAL/PLATELET
Basophils Absolute: 0 K/uL (ref 0.0–0.1)
Basophils Relative: 0.8 % (ref 0.0–3.0)
Eosinophils Absolute: 0.1 K/uL (ref 0.0–0.7)
Eosinophils Relative: 2.8 % (ref 0.0–5.0)
HCT: 41.6 % (ref 39.0–52.0)
Hemoglobin: 14.1 g/dL (ref 13.0–17.0)
Lymphocytes Relative: 21.5 % (ref 12.0–46.0)
Lymphs Abs: 1 K/uL (ref 0.7–4.0)
MCHC: 34 g/dL (ref 30.0–36.0)
MCV: 92.5 fl (ref 78.0–100.0)
Monocytes Absolute: 0.5 K/uL (ref 0.1–1.0)
Monocytes Relative: 10.1 % (ref 3.0–12.0)
Neutro Abs: 3.1 K/uL (ref 1.4–7.7)
Neutrophils Relative %: 64.8 % (ref 43.0–77.0)
Platelets: 262 K/uL (ref 150.0–400.0)
RBC: 4.5 Mil/uL (ref 4.22–5.81)
RDW: 13.6 % (ref 11.5–15.5)
WBC: 4.8 K/uL (ref 4.0–10.5)

## 2024-11-22 LAB — IBC + FERRITIN
Ferritin: 48 ng/mL (ref 22.0–322.0)
Iron: 102 ug/dL (ref 42–165)
Saturation Ratios: 35.7 % (ref 20.0–50.0)
TIBC: 285.6 ug/dL (ref 250.0–450.0)
Transferrin: 204 mg/dL — ABNORMAL LOW (ref 212.0–360.0)

## 2024-11-22 MED ORDER — LINACLOTIDE 145 MCG PO CAPS: 145.0000 ug | ORAL_CAPSULE | Freq: Every day | ORAL | Status: AC

## 2024-11-22 NOTE — Patient Instructions (Addendum)
 _______________________________________________________  If your blood pressure at your visit was 140/90 or greater, please contact your primary care physician to follow up on this.  _______________________________________________________  If you are age 87 or older, your body mass index should be between 23-30. Your Body mass index is 31.68 kg/m. If this is out of the aforementioned range listed, please consider follow up with your Primary Care Provider.  If you are age 73 or younger, your body mass index should be between 19-25. Your Body mass index is 31.68 kg/m. If this is out of the aformentioned range listed, please consider follow up with your Primary Care Provider.   ________________________________________________________  The [facility] providers would like to encourage you to use MYCHART to communicate with providers for non-urgent requests or questions.  Due to long hold times on the telephone, sending your provider a message by [facility] may be a faster and more efficient way to get a response.  Please allow 48 business hours for a response.  Please remember that this is for non-urgent requests.  _______________________________________________________  [facility] is using a team-based approach to care.  Your team is made up of your doctor and two to three APPS. Our APPS (Nurse Practitioners and Physician Assistants) work with your physician to ensure care continuity for you. They are fully qualified to address your health concerns and develop a treatment plan. They communicate directly with your gastroenterologist to care for you. Seeing the Advanced Practice Practitioners on your physician's team can help you by facilitating care more promptly, often allowing for earlier appointments, access to diagnostic testing, procedures, and other specialty referrals.    Your provider has requested that you go to the basement level for lab work before leaving today. Press B on  the elevator. The lab is located at the first door on the left as you exit the elevator.  Due to recent changes in healthcare laws, you may see the results of your imaging and laboratory studies on MyChart before your provider has had a chance to review them.  We understand that in some cases there may be results that are confusing or concerning to you. Not all laboratory results come back in the same time frame and the provider may be waiting for multiple results in order to interpret others.  Please give us  48 hours in order for your provider to thoroughly review all the results before contacting the office for clarification of your results.    Please do the following: Purchase a bottle of Miralax over the counter as well as a box of 5 mg dulcolax tablets. Take 4 dulcolax tablets. Wait 1 hour. You will then drink 6-8 capfuls of Miralax mixed in an adequate amount of water/juice/gatorade (you may choose which of these liquids to drink) over the next 2-3 hours. You should expect results within 1 to 6 hours after completing the bowel purge. Go to the er if you have severe AB pain, can not pass gas or stool in over 12 hours, can not hold down any food.   VISIT SUMMARY:  Today, we discussed your gastrointestinal symptoms and reviewed your current health status. We addressed your chronic constipation, hiatal hernia, and iron  deficiency anemia.  YOUR PLAN:  NEUROGENIC BOWEL WITH CHRONIC CONSTIPATION: You have chronic constipation with infrequent bowel movements and occasional severe diarrhea, likely due to overflow. -We have started a bowel purge to help clear your system. -You received samples of Linzess and fiber to help manage your bowel movements. -You can use  Senokot or Dulcolax if needed. -Consider using enemas if you experience issues during travel.  HIATAL HERNIA: You have a moderate to large hiatal hernia but are not currently experiencing symptoms. -Continue taking pantoprazole  for  reflux management. -Monitor for any new symptoms. -If symptoms return or if your anemia comes back, we may consider an endoscopy.  IRON  DEFICIENCY ANEMIA: Your iron  levels have previously improved without supplementation, and you have no current symptoms. -We rechecked your iron  levels today. -If your anemia returns, we may consider an endoscopy to investigate further.

## 2024-11-28 ENCOUNTER — Encounter: Payer: Self-pay | Admitting: Physician Assistant

## 2024-12-08 ENCOUNTER — Telehealth: Payer: Self-pay

## 2024-12-08 NOTE — Telephone Encounter (Signed)
 Returned patient's call. He is a pt of Dr. Gaither and has a h/o prostate ca s/p EBRT, urinary retention on CIC.   Wife reports sudden onset groin swelling and tenderness (a lump in left groin) that began last night. No fevers. Pt has not had a BM today, though this is not unusual for him. Able to CIC without difficulty.   Discussed that I don't think this is related to his prostate cancer. It does sound concerning for possible incarcerated inguinal hernia or infection/abscess. I recommended patient be evaluated in the ED. Pt's wife was hesitant d/t long wait times, but I re-asserted that it did not sound like something that could be managed over the phone and likely was not related to his prostate ca. She voiced understanding and was appreciative of call.   Maurilio Agar

## 2024-12-10 DIAGNOSIS — R338 Other retention of urine: Secondary | ICD-10-CM | POA: Diagnosis not present

## 2024-12-10 DIAGNOSIS — N492 Inflammatory disorders of scrotum: Secondary | ICD-10-CM | POA: Diagnosis not present

## 2024-12-26 NOTE — Telephone Encounter (Unsigned)
 Copied from CRM [id number]. Topic: Appointments - Scheduling Inquiry for Clinic >> Dec 26, 2024  1:45 PM Mesmerise C wrote: Reason for CRM: Patient's appt on 2/5 needs to be rescheduled but would prefer a male provider instead inquiring if another provider in office can take him on as a new pt since a long term pt of Dr. Norleen

## 2025-01-03 ENCOUNTER — Other Ambulatory Visit: Payer: Self-pay | Admitting: Internal Medicine

## 2025-01-03 ENCOUNTER — Other Ambulatory Visit: Payer: Self-pay

## 2025-01-08 ENCOUNTER — Telehealth: Payer: Self-pay

## 2025-01-08 NOTE — Telephone Encounter (Signed)
 Copied from CRM [id number]. Topic: Appointments - Transfer of Care >> Jan 08, 2025  2:31 PM Chasity T wrote: Pt is requesting to transfer FROM: Daniel Rowland Pt is requesting to transfer TO: Corean Ku Reason for requested transfer: current pcp is leaving practice It is the responsibility of the team the patient would like to transfer to (Dr. Corean Ku) to reach out to the patient if for any reason this transfer is not acceptable.

## 2025-01-22 ENCOUNTER — Ambulatory Visit: Admitting: Physician Assistant

## 2025-01-24 ENCOUNTER — Ambulatory Visit: Admitting: Internal Medicine

## 2025-02-05 ENCOUNTER — Ambulatory Visit: Admitting: Internal Medicine

## 2025-06-18 ENCOUNTER — Encounter: Admitting: Family Medicine
